# Patient Record
Sex: Female | Born: 1945
Health system: Southern US, Community
[De-identification: ages and names within clinical notes are randomized; demographics above are authoritative.]

## PROBLEM LIST (undated history)

## (undated) DIAGNOSIS — J449 Chronic obstructive pulmonary disease, unspecified: Secondary | ICD-10-CM

## (undated) DIAGNOSIS — I10 Essential (primary) hypertension: Secondary | ICD-10-CM

## (undated) DIAGNOSIS — I4891 Unspecified atrial fibrillation: Secondary | ICD-10-CM

## (undated) DIAGNOSIS — G500 Trigeminal neuralgia: Secondary | ICD-10-CM

## (undated) DIAGNOSIS — F17200 Nicotine dependence, unspecified, uncomplicated: Secondary | ICD-10-CM

## (undated) DIAGNOSIS — E785 Hyperlipidemia, unspecified: Secondary | ICD-10-CM

## (undated) DIAGNOSIS — R0600 Dyspnea, unspecified: Secondary | ICD-10-CM

## (undated) HISTORY — PX: CATARACT EXTRACTION: SUR2

## (undated) HISTORY — PX: APPENDECTOMY: SHX54

---

## 1983-07-06 HISTORY — PX: APPENDECTOMY: SHX54

## 2011-07-06 DIAGNOSIS — M1990 Unspecified osteoarthritis, unspecified site: Secondary | ICD-10-CM

## 2011-07-06 HISTORY — DX: Unspecified osteoarthritis, unspecified site: M19.90

## 2015-11-03 HISTORY — PX: EYE SURGERY: SHX253

## 2016-04-09 DIAGNOSIS — E7800 Pure hypercholesterolemia, unspecified: Secondary | ICD-10-CM | POA: Insufficient documentation

## 2016-04-09 DIAGNOSIS — E119 Type 2 diabetes mellitus without complications: Secondary | ICD-10-CM | POA: Diagnosis not present

## 2016-04-09 DIAGNOSIS — I10 Essential (primary) hypertension: Secondary | ICD-10-CM | POA: Insufficient documentation

## 2016-04-09 DIAGNOSIS — G500 Trigeminal neuralgia: Secondary | ICD-10-CM | POA: Diagnosis not present

## 2016-04-09 DIAGNOSIS — Z7189 Other specified counseling: Secondary | ICD-10-CM | POA: Insufficient documentation

## 2016-04-09 DIAGNOSIS — Z7185 Encounter for immunization safety counseling: Secondary | ICD-10-CM | POA: Insufficient documentation

## 2016-04-15 DIAGNOSIS — G500 Trigeminal neuralgia: Secondary | ICD-10-CM | POA: Diagnosis not present

## 2016-05-20 DIAGNOSIS — G500 Trigeminal neuralgia: Secondary | ICD-10-CM | POA: Diagnosis not present

## 2016-05-26 DIAGNOSIS — G500 Trigeminal neuralgia: Secondary | ICD-10-CM | POA: Diagnosis not present

## 2016-06-03 DIAGNOSIS — G500 Trigeminal neuralgia: Secondary | ICD-10-CM | POA: Diagnosis not present

## 2016-06-16 DIAGNOSIS — Z961 Presence of intraocular lens: Secondary | ICD-10-CM | POA: Diagnosis not present

## 2016-07-23 DIAGNOSIS — G500 Trigeminal neuralgia: Secondary | ICD-10-CM | POA: Diagnosis not present

## 2016-10-15 DIAGNOSIS — M25511 Pain in right shoulder: Secondary | ICD-10-CM | POA: Diagnosis not present

## 2016-10-15 DIAGNOSIS — M19012 Primary osteoarthritis, left shoulder: Secondary | ICD-10-CM | POA: Diagnosis not present

## 2016-10-15 DIAGNOSIS — M25512 Pain in left shoulder: Secondary | ICD-10-CM | POA: Diagnosis not present

## 2016-10-15 DIAGNOSIS — I10 Essential (primary) hypertension: Secondary | ICD-10-CM | POA: Diagnosis not present

## 2016-10-15 DIAGNOSIS — M19011 Primary osteoarthritis, right shoulder: Secondary | ICD-10-CM | POA: Diagnosis not present

## 2016-10-15 DIAGNOSIS — R7303 Prediabetes: Secondary | ICD-10-CM | POA: Diagnosis not present

## 2016-10-15 DIAGNOSIS — E7800 Pure hypercholesterolemia, unspecified: Secondary | ICD-10-CM | POA: Diagnosis not present

## 2016-10-22 DIAGNOSIS — Z0000 Encounter for general adult medical examination without abnormal findings: Secondary | ICD-10-CM | POA: Diagnosis not present

## 2016-10-22 DIAGNOSIS — F17200 Nicotine dependence, unspecified, uncomplicated: Secondary | ICD-10-CM | POA: Diagnosis not present

## 2016-10-22 DIAGNOSIS — E7800 Pure hypercholesterolemia, unspecified: Secondary | ICD-10-CM | POA: Diagnosis not present

## 2016-10-22 DIAGNOSIS — I10 Essential (primary) hypertension: Secondary | ICD-10-CM | POA: Diagnosis not present

## 2016-10-22 DIAGNOSIS — Z23 Encounter for immunization: Secondary | ICD-10-CM | POA: Diagnosis not present

## 2016-10-28 DIAGNOSIS — M8588 Other specified disorders of bone density and structure, other site: Secondary | ICD-10-CM | POA: Diagnosis not present

## 2016-10-29 DIAGNOSIS — M8589 Other specified disorders of bone density and structure, multiple sites: Secondary | ICD-10-CM | POA: Insufficient documentation

## 2016-11-01 DIAGNOSIS — M8580 Other specified disorders of bone density and structure, unspecified site: Secondary | ICD-10-CM | POA: Diagnosis not present

## 2016-11-19 ENCOUNTER — Encounter: Payer: PPO | Attending: Family Medicine | Admitting: Dietician

## 2016-11-19 ENCOUNTER — Encounter: Payer: Self-pay | Admitting: Dietician

## 2016-11-19 VITALS — Ht 66.0 in | Wt 142.5 lb

## 2016-11-19 DIAGNOSIS — E7849 Other hyperlipidemia: Secondary | ICD-10-CM

## 2016-11-19 DIAGNOSIS — Z029 Encounter for administrative examinations, unspecified: Secondary | ICD-10-CM | POA: Insufficient documentation

## 2016-11-19 DIAGNOSIS — G500 Trigeminal neuralgia: Secondary | ICD-10-CM | POA: Insufficient documentation

## 2016-11-19 NOTE — Patient Instructions (Addendum)
-   Try Pesto as a condiment on sandwiches rather than Mayo. It provides a source of healthy fat  - Practice eating less processed meats, or substituting for [place] or chicken versions. Example: chicken sausage, [place] bacon  - Make one small change at a time, remember slow and steady progress creates lasting changing.

## 2016-11-19 NOTE — Progress Notes (Signed)
Medical Nutrition Therapy: Visit start time: 9417  end time: 1130  Assessment:  Diagnosis: HLD, HTN Past medical history: osteopenia, see chart Psychosocial issues/ stress concerns: none Preferred learning method:  . Auditory . Visual . Hands-on  Current weight: 142.5lb  Height: 5\' 6"  Medications, supplements: MVI, Ca+Vit D3, Lipitor, Tumeric, see chart for full list  Progress and evaluation: Patient reports h/o prediabetes and family history of diabetes. Desires to learn how to eat in order to prevent being diagnosed with diabetes. Reports h/o 29# wt loss since August 2016 with ultimate goal wt of 135#. Currently 7.5# above goal wt. Interventions to date include reducing red meat intake to 2x/week, eating less starches and more vegetables, not eating fried food or drinking sugar-sweetened beverages, not cooking with salt and rarely adding salt to cooked foods. Also reports not being a big "sweet eater"- has a dessert-type item once every two weeks. States she would rather continue to use full fat products such as yogurt and butter but cut portions in half. Reports not to make much food at home. Buys pre-cooked meals, frozen meals (Healthy Choice brand) or ready-to-eat salads like potato salad.  Physical activity: walks her dog daily. Considering taking up a low-impact activity such as biking.   Dietary Intake:  Usual eating pattern includes 3 meals and 0-1 snacks per day. Dining out frequency: 1 meal every 2 weeks.  Breakfast: scallion pancake with butter & sausage, oatmeal occasionally Snack: n/a Lunch: potato salad storebought from [employer], chicken Snack: n/a Supper: half a sandwich, quesadilla with cheese + chicken, fish salad Snack: 2 girlscout cookies Beverages: low sodium  V8 juice, water, green tea, coffee, sparking water  Nutrition Care Education: Topics covered: lowering cholesterol / heart health, portion sizes and portion control, buying low sodium options, ideas for  alternative/ more healthy condiments & spreads I.e. [place] yogurt for sour cream on baked potatoes or pesto for mayonnaise on sandwiches, whole-foods based eating Basic nutrition: basic food groups, appropriate nutrient balance, general nutrition guidelines    Weight control: benefits of weight control, behavioral changes for weight loss Advanced nutrition: cooking techniques, dining out, food label reading Hypertension: identifying high sodium foods Hyperlipidemia: healthy and unhealthy fats, role of fiber Other lifestyle changes:  benefits of making changes, readiness for change, identifying habits that need to change  Nutritional Diagnosis:  NI-5.6.3 Inappropriate intake of fats (specify): saturated and trans fats As related to dietary habits/ preferences .  As evidenced by patient report of preference for and daily use of full-fat produts such as butter and Mayonnaise.  Intervention: Discussion as noted above. Goals provided to patient to work on going forward. Encouraged her to continue current weight loss effort.  Education Materials given:  . General diet guidelines for Hypertension . Your Game Plan to Prevent Type II Diabetes . Goals/ instructions  Learner/ who was taught:  . Patient   Level of understanding: Marland Kitchen Verbalizes/ demonstrates competency  Demonstrated degree of understanding via:   Teach back Learning barriers: . None  Willingness to learn/ readiness for change: . Acceptance, ready for change  Monitoring and Evaluation:  Dietary intake, exercise, and body weight      follow up: prn

## 2016-12-13 DIAGNOSIS — H353132 Nonexudative age-related macular degeneration, bilateral, intermediate dry stage: Secondary | ICD-10-CM | POA: Diagnosis not present

## 2016-12-29 DIAGNOSIS — G500 Trigeminal neuralgia: Secondary | ICD-10-CM | POA: Diagnosis not present

## 2017-01-19 ENCOUNTER — Other Ambulatory Visit: Payer: Self-pay | Admitting: Family Medicine

## 2017-01-19 DIAGNOSIS — Z1231 Encounter for screening mammogram for malignant neoplasm of breast: Secondary | ICD-10-CM

## 2017-02-03 ENCOUNTER — Ambulatory Visit
Admission: RE | Admit: 2017-02-03 | Discharge: 2017-02-03 | Disposition: A | Discharge status: Home / Self Care | Payer: PPO | Source: Ambulatory Visit | Attending: Family Medicine | Admitting: Family Medicine

## 2017-02-03 DIAGNOSIS — Z1231 Encounter for screening mammogram for malignant neoplasm of breast: Secondary | ICD-10-CM

## 2017-02-15 ENCOUNTER — Inpatient Hospital Stay
Admission: RE | Admit: 2017-02-15 | Discharge: 2017-02-15 | Disposition: A | Discharge status: Home / Self Care | Payer: Self-pay | Source: Ambulatory Visit | Attending: *Deleted | Admitting: *Deleted

## 2017-02-15 ENCOUNTER — Other Ambulatory Visit: Payer: Self-pay | Admitting: *Deleted

## 2017-02-15 DIAGNOSIS — Z9289 Personal history of other medical treatment: Secondary | ICD-10-CM

## 2017-04-21 DIAGNOSIS — I10 Essential (primary) hypertension: Secondary | ICD-10-CM | POA: Diagnosis not present

## 2017-04-21 DIAGNOSIS — F17200 Nicotine dependence, unspecified, uncomplicated: Secondary | ICD-10-CM | POA: Diagnosis not present

## 2017-04-21 DIAGNOSIS — Z23 Encounter for immunization: Secondary | ICD-10-CM | POA: Diagnosis not present

## 2017-04-21 DIAGNOSIS — E7800 Pure hypercholesterolemia, unspecified: Secondary | ICD-10-CM | POA: Diagnosis not present

## 2017-07-07 DIAGNOSIS — G500 Trigeminal neuralgia: Secondary | ICD-10-CM | POA: Diagnosis not present

## 2017-07-14 DIAGNOSIS — G500 Trigeminal neuralgia: Secondary | ICD-10-CM | POA: Diagnosis not present

## 2017-07-27 DIAGNOSIS — H353132 Nonexudative age-related macular degeneration, bilateral, intermediate dry stage: Secondary | ICD-10-CM | POA: Diagnosis not present

## 2017-09-07 DIAGNOSIS — B351 Tinea unguium: Secondary | ICD-10-CM | POA: Diagnosis not present

## 2017-09-07 DIAGNOSIS — L600 Ingrowing nail: Secondary | ICD-10-CM | POA: Diagnosis not present

## 2017-10-28 DIAGNOSIS — Z79899 Other long term (current) drug therapy: Secondary | ICD-10-CM | POA: Diagnosis not present

## 2017-10-28 DIAGNOSIS — F17200 Nicotine dependence, unspecified, uncomplicated: Secondary | ICD-10-CM | POA: Diagnosis not present

## 2017-10-28 DIAGNOSIS — E7800 Pure hypercholesterolemia, unspecified: Secondary | ICD-10-CM | POA: Diagnosis not present

## 2017-10-28 DIAGNOSIS — G500 Trigeminal neuralgia: Secondary | ICD-10-CM | POA: Diagnosis not present

## 2017-10-28 DIAGNOSIS — I10 Essential (primary) hypertension: Secondary | ICD-10-CM | POA: Diagnosis not present

## 2017-10-28 DIAGNOSIS — Z0000 Encounter for general adult medical examination without abnormal findings: Secondary | ICD-10-CM | POA: Diagnosis not present

## 2017-12-20 DIAGNOSIS — H353131 Nonexudative age-related macular degeneration, bilateral, early dry stage: Secondary | ICD-10-CM | POA: Diagnosis not present

## 2018-01-03 DIAGNOSIS — H26492 Other secondary cataract, left eye: Secondary | ICD-10-CM | POA: Diagnosis not present

## 2018-01-04 DIAGNOSIS — G500 Trigeminal neuralgia: Secondary | ICD-10-CM | POA: Diagnosis not present

## 2018-01-10 DIAGNOSIS — I499 Cardiac arrhythmia, unspecified: Secondary | ICD-10-CM | POA: Diagnosis not present

## 2018-02-01 ENCOUNTER — Other Ambulatory Visit: Payer: Self-pay | Admitting: Family Medicine

## 2018-02-01 DIAGNOSIS — Z1231 Encounter for screening mammogram for malignant neoplasm of breast: Secondary | ICD-10-CM

## 2018-03-07 ENCOUNTER — Ambulatory Visit
Admission: RE | Admit: 2018-03-07 | Discharge: 2018-03-07 | Disposition: A | Discharge status: Home / Self Care | Payer: PPO | Source: Ambulatory Visit | Attending: Family Medicine | Admitting: Family Medicine

## 2018-03-07 DIAGNOSIS — Z1231 Encounter for screening mammogram for malignant neoplasm of breast: Secondary | ICD-10-CM | POA: Diagnosis not present

## 2018-04-27 DIAGNOSIS — F17200 Nicotine dependence, unspecified, uncomplicated: Secondary | ICD-10-CM | POA: Diagnosis not present

## 2018-04-27 DIAGNOSIS — I10 Essential (primary) hypertension: Secondary | ICD-10-CM | POA: Diagnosis not present

## 2018-04-27 DIAGNOSIS — E7800 Pure hypercholesterolemia, unspecified: Secondary | ICD-10-CM | POA: Diagnosis not present

## 2018-04-28 DIAGNOSIS — G500 Trigeminal neuralgia: Secondary | ICD-10-CM | POA: Diagnosis not present

## 2018-05-08 DIAGNOSIS — G500 Trigeminal neuralgia: Secondary | ICD-10-CM | POA: Diagnosis not present

## 2018-06-07 DIAGNOSIS — G500 Trigeminal neuralgia: Secondary | ICD-10-CM | POA: Diagnosis not present

## 2018-10-10 DIAGNOSIS — R55 Syncope and collapse: Secondary | ICD-10-CM | POA: Diagnosis not present

## 2018-10-12 DIAGNOSIS — I6523 Occlusion and stenosis of bilateral carotid arteries: Secondary | ICD-10-CM | POA: Diagnosis not present

## 2018-10-12 DIAGNOSIS — R55 Syncope and collapse: Secondary | ICD-10-CM | POA: Diagnosis not present

## 2018-10-17 DIAGNOSIS — D72829 Elevated white blood cell count, unspecified: Secondary | ICD-10-CM | POA: Diagnosis not present

## 2018-10-25 DIAGNOSIS — E041 Nontoxic single thyroid nodule: Secondary | ICD-10-CM | POA: Diagnosis not present

## 2018-10-30 DIAGNOSIS — E7800 Pure hypercholesterolemia, unspecified: Secondary | ICD-10-CM | POA: Diagnosis not present

## 2018-10-30 DIAGNOSIS — F17200 Nicotine dependence, unspecified, uncomplicated: Secondary | ICD-10-CM | POA: Diagnosis not present

## 2018-10-30 DIAGNOSIS — Z0000 Encounter for general adult medical examination without abnormal findings: Secondary | ICD-10-CM | POA: Diagnosis not present

## 2018-10-30 DIAGNOSIS — I10 Essential (primary) hypertension: Secondary | ICD-10-CM | POA: Diagnosis not present

## 2018-11-06 DIAGNOSIS — G500 Trigeminal neuralgia: Secondary | ICD-10-CM | POA: Diagnosis not present

## 2018-11-15 DIAGNOSIS — E041 Nontoxic single thyroid nodule: Secondary | ICD-10-CM | POA: Diagnosis not present

## 2018-12-11 DIAGNOSIS — G500 Trigeminal neuralgia: Secondary | ICD-10-CM | POA: Diagnosis not present

## 2019-03-05 DIAGNOSIS — I10 Essential (primary) hypertension: Secondary | ICD-10-CM | POA: Diagnosis not present

## 2019-03-05 DIAGNOSIS — E7800 Pure hypercholesterolemia, unspecified: Secondary | ICD-10-CM | POA: Diagnosis not present

## 2019-03-05 DIAGNOSIS — F17200 Nicotine dependence, unspecified, uncomplicated: Secondary | ICD-10-CM | POA: Diagnosis not present

## 2019-04-10 DIAGNOSIS — G500 Trigeminal neuralgia: Secondary | ICD-10-CM | POA: Diagnosis not present

## 2019-04-11 DIAGNOSIS — S81801A Unspecified open wound, right lower leg, initial encounter: Secondary | ICD-10-CM | POA: Diagnosis not present

## 2019-04-12 DIAGNOSIS — G500 Trigeminal neuralgia: Secondary | ICD-10-CM | POA: Diagnosis not present

## 2019-04-16 ENCOUNTER — Other Ambulatory Visit: Payer: Self-pay | Admitting: Neurology

## 2019-04-16 DIAGNOSIS — G500 Trigeminal neuralgia: Secondary | ICD-10-CM

## 2019-04-19 ENCOUNTER — Other Ambulatory Visit: Payer: Self-pay | Admitting: Family Medicine

## 2019-04-19 DIAGNOSIS — Z1231 Encounter for screening mammogram for malignant neoplasm of breast: Secondary | ICD-10-CM

## 2019-04-19 DIAGNOSIS — S81801D Unspecified open wound, right lower leg, subsequent encounter: Secondary | ICD-10-CM | POA: Diagnosis not present

## 2019-04-26 ENCOUNTER — Ambulatory Visit: Payer: PPO

## 2019-04-26 ENCOUNTER — Encounter: Payer: PPO | Attending: Physician Assistant | Admitting: Physician Assistant

## 2019-04-26 ENCOUNTER — Other Ambulatory Visit: Payer: Self-pay

## 2019-04-26 DIAGNOSIS — J449 Chronic obstructive pulmonary disease, unspecified: Secondary | ICD-10-CM | POA: Insufficient documentation

## 2019-04-26 DIAGNOSIS — Z833 Family history of diabetes mellitus: Secondary | ICD-10-CM | POA: Insufficient documentation

## 2019-04-26 DIAGNOSIS — Z8249 Family history of ischemic heart disease and other diseases of the circulatory system: Secondary | ICD-10-CM | POA: Insufficient documentation

## 2019-04-26 DIAGNOSIS — Z841 Family history of disorders of kidney and ureter: Secondary | ICD-10-CM | POA: Insufficient documentation

## 2019-04-26 DIAGNOSIS — M1990 Unspecified osteoarthritis, unspecified site: Secondary | ICD-10-CM | POA: Insufficient documentation

## 2019-04-26 DIAGNOSIS — L97819 Non-pressure chronic ulcer of other part of right lower leg with unspecified severity: Secondary | ICD-10-CM | POA: Insufficient documentation

## 2019-04-26 DIAGNOSIS — I10 Essential (primary) hypertension: Secondary | ICD-10-CM | POA: Insufficient documentation

## 2019-04-26 DIAGNOSIS — M810 Age-related osteoporosis without current pathological fracture: Secondary | ICD-10-CM | POA: Insufficient documentation

## 2019-04-28 NOTE — Progress Notes (Signed)
PEARLENA, HENNIGH ([id number]) Visit Report for 04/26/2019 Allergy List Details Patient Name: Sarah Cunningham, Sarah L. Date of Service: 04/26/2019 1:15 PM Medical Record Number: [id number] Patient Account Number: [id number] Date of Birth/Sex: 08-14-1945 (73 y.o. Female) Treating RN: Sarah Cunningham Primary Care Sarah Cunningham: Sarah Cunningham Other Clinician: Referring Sarah Cunningham: Sarah Cunningham Treating Sarah Cunningham/Extender: Sarah Cunningham, Sarah Cunningham Sarah Cunningham: 0 Allergies Active Allergies Tegretol Reaction: rash and trouble breething Severity: Severe Allergy Notes Electronic Signature(s) Signed: 04/27/2019 5:23:28 PM By: Sarah Cunningham, BSN, RN, CWS, Kim RN, BSN Entered By: Sarah Cunningham, BSN, RN, CWS, Sarah Cunningham on 04/26/2019 13:32:09 Sarah Cunningham ([id number]) -------------------------------------------------------------------------------- Arrival Information Details Patient Name: Sarah Flake L. Date of Service: 04/26/2019 1:15 PM Medical Record Number: [id number] Patient Account Number: [id number] Date of Birth/Sex: 07-Jan-1946 (74 y.o. Female) Treating RN: Sarah Cunningham Primary Care Sarah Cunningham: Sarah Cunningham Other Clinician: Referring [name]: Sarah Cunningham Treating Sarah Cunningham/Extender: Sarah Cunningham, Sarah Cunningham Sarah Cunningham: 0 Visit Information Patient Arrived: Ambulatory Arrival Time: 13:26 Accompanied By: self Transfer Assistance: None Patient Identification Verified: Yes Secondary Verification Process Completed: Yes Electronic Signature(s) Signed: 04/26/2019 4:02:16 PM By: Sarah Cunningham Sarah Cunningham, Sarah Cunningham, Sarah Cunningham Entered By: Sarah Cunningham on 04/26/2019 13:27:08 Sarah Cunningham ([id number]) -------------------------------------------------------------------------------- Clinic Level of Care Assessment Details Patient Name: Sarah Cunningham, Sarah L. Date of Service: 04/26/2019 1:15 PM Medical Record Number: [id number] Patient Account Number: [id number] Date of Birth/Sex: 1946/03/15  (73 y.o. Female) Treating RN: Sarah Cunningham Primary Care Sarah Cunningham: Sarah Cunningham Other Clinician: Referring Sarah Cunningham: Sarah Cunningham Treating Sarah Cunningham/Extender: Sarah Cunningham, Sarah Cunningham Sarah Cunningham: 0 Clinic Level of Care Assessment Items TOOL 2 Quantity Score []  - Use when only an EandM is performed on the INITIAL visit 0 ASSESSMENTS - Nursing Assessment / Reassessment X - General Physical Exam (combine w/ comprehensive assessment (listed just below) when 1 20 performed on new pt. evals) X- 1 25 Comprehensive Assessment (HX, ROS, Risk Assessments, Wounds Hx, etc.) ASSESSMENTS - Wound and Skin Assessment / Reassessment X - Simple Wound Assessment / Reassessment - one wound 1 5 []  - 0 Complex Wound Assessment / Reassessment - multiple wounds []  - 0 Dermatologic / Skin Assessment (not related to wound area) ASSESSMENTS - Ostomy and/or Continence Assessment and Care []  - Incontinence Assessment and Management 0 []  - 0 Ostomy Care Assessment and Management (repouching, etc.) PROCESS - Coordination of Care X - Simple Patient / Family Education for ongoing care 1 15 []  - 0 Complex (extensive) Patient / Family Education for ongoing care X- 1 10 Staff obtains Programmer, systems, Records, Test Results / Process Orders []  - 0 Staff telephones HHA, Nursing Homes / Clarify orders / etc []  - 0 Routine Transfer to another Facility (non-emergent condition) []  - 0 Routine Hospital Admission (non-emergent condition) X- 1 15 New Admissions / Biomedical engineer / Ordering NPWT, Apligraf, etc. []  - 0 Emergency Hospital Admission (emergent condition) X- 1 10 Simple Discharge Coordination []  - 0 Complex (extensive) Discharge Coordination PROCESS - Special Needs []  - Pediatric / Minor Patient Management 0 []  - 0 Isolation Patient Management Sarah Cunningham, Sarah L. ([id number]) []  - 0 Hearing / Language / Visual special needs []  - 0 Assessment of Community assistance (transportation, D/C  planning, etc.) []  - 0 Additional assistance / Altered mentation []  - 0 Support Surface(s) Assessment (bed, cushion, seat, etc.) INTERVENTIONS - Wound Cleansing / Measurement X - Wound Imaging (photographs - any number of wounds) 1 5 []  - 0 Wound Tracing (instead of photographs) X- 1 5 Simple Wound Measurement - one wound []  - 0 Complex Wound  Measurement - multiple wounds X- 1 5 Simple Wound Cleansing - one wound []  - 0 Complex Wound Cleansing - multiple wounds INTERVENTIONS - Wound Dressings X - Small Wound Dressing one or multiple wounds 1 10 []  - 0 Medium Wound Dressing one or multiple wounds []  - 0 Large Wound Dressing one or multiple wounds []  - 0 Application of Medications - injection INTERVENTIONS - Miscellaneous []  - External ear exam 0 []  - 0 Specimen Collection (cultures, biopsies, blood, Cunningham fluids, etc.) []  - 0 Specimen(s) / Culture(s) sent or taken to Lab for analysis []  - 0 Patient Transfer (multiple staff / Civil Service fast streamer / Similar devices) []  - 0 Simple Staple / Suture removal (25 or less) []  - 0 Complex Staple / Suture removal (26 or more) []  - 0 Hypo / Hyperglycemic Management (close monitor of Blood Glucose) []  - 0 Ankle / Brachial Index (ABI) - do not check if billed separately Has the patient been seen at the hospital within the last three years: Yes Total Score: 125 Level Of Care: New/Established - Level 4 Electronic Signature(s) Signed: 04/26/2019 4:16:03 PM By: Sarah Cunningham Entered By: Sarah Cunningham on 04/26/2019 14:15:17 Sarah Cunningham, Sarah Cunningham ([id number]) -------------------------------------------------------------------------------- Encounter Discharge Information Details Patient Name: Sarah Flake L. Date of Service: 04/26/2019 1:15 PM Medical Record Number: [id number] Patient Account Number: [id number] Date of Birth/Sex: 08-31-1945 (73 y.o. Female) Treating RN: Sarah Cunningham Primary Care Sarah Cunningham: Sarah Cunningham Other  Clinician: Referring Sarah Cunningham: Sarah Cunningham Treating Sarah Cunningham/Extender: Sarah Cunningham, Sarah Cunningham Sarah Cunningham: 0 Encounter Discharge Information Items Discharge Condition: Stable Ambulatory Status: Ambulatory Discharge Destination: Home Transportation: Private Auto Accompanied By: self Schedule Follow-up Appointment: Yes Clinical Summary of Care: Electronic Signature(s) Signed: 04/26/2019 4:16:03 PM By: Sarah Cunningham Entered By: Sarah Cunningham on 04/26/2019 14:17:24 Arno, Jaina L. ([id number]) -------------------------------------------------------------------------------- Lower Extremity Assessment Details Patient Name: Namba, Daisey L. Date of Service: 04/26/2019 1:15 PM Medical Record Number: [id number] Patient Account Number: [id number] Date of Birth/Sex: 1945-11-17 (73 y.o. Female) Treating RN: Sarah Cunningham Primary Care Shermika Balthaser: Sarah Cunningham Other Clinician: Referring Chandler Swiderski: Sarah Cunningham Treating Boby Eyer/Extender: Sarah Cunningham, Sarah Cunningham Sarah Cunningham: 0 Edema Assessment Assessed: [Left: No] [Right: Yes] Edema: [Left: N] [Right: o] Vascular Assessment Pulses: Dorsalis Pedis Palpable: [Right:Yes] Electronic Signature(s) Signed: 04/27/2019 5:23:28 PM By: Sarah Cunningham, BSN, RN, CWS, Kim RN, BSN Entered By: Sarah Cunningham, BSN, RN, CWS, Sarah Cunningham on 04/26/2019 13:44:30 Sarah Cunningham, Sarah Cunningham ([id number]) -------------------------------------------------------------------------------- Multi Wound Chart Details Patient Name: Sarah Flake L. Date of Service: 04/26/2019 1:15 PM Medical Record Number: [id number] Patient Account Number: [id number] Date of Birth/Sex: 06/15/1946 (73 y.o. Female) Treating RN: Sarah Cunningham Primary Care Yuleimy Kretz: Sarah Cunningham Other Clinician: Referring Ryanna Teschner: Sarah Cunningham Treating Kayron Kalmar/Extender: Sarah Cunningham, Sarah Cunningham Sarah Cunningham: 0 Vital Signs Height(in): 51 Pulse(bpm): 25 Weight(lbs): 138 Blood Pressure(mmHg): 150/84 Cunningham Mass  Index(BMI): 22 Temperature(F): 98.1 Respiratory Rate 16 (breaths/min): Photos: [N/A:N/A] Wound Location: Right Lower Leg - Medial N/A N/A Wounding Event: Trauma N/A N/A Primary Etiology: Trauma, Other N/A N/A Comorbid History: Cataracts, Chronic Obstructive N/A N/A Pulmonary Disease (COPD), Hypertension Date Acquired: 04/06/2019 N/A N/A Sarah of Cunningham: 0 N/A N/A Wound Status: Open N/A N/A Measurements L x W x D 0.9x1x0.1 N/A N/A (cm) Area (cm) : 0.707 N/A N/A Volume (cm) : 0.071 N/A N/A Classification: Unclassifiable N/A N/A Exudate Amount: None Present N/A N/A Wound Margin: Flat and Intact N/A N/A Granulation Amount: None Present (0%) N/A N/A Necrotic Amount: Large (67-100%) N/A N/A Necrotic Tissue: Eschar N/A N/A Exposed Structures: Fascia: No N/A N/A Fat Layer (  Subcutaneous Tissue) Exposed: No Tendon: No Muscle: No Joint: No Bone: No Epithelialization: None N/A N/A Cunningham Notes Sarah Cunningham, Sarah Cunningham ([id number]) Electronic Signature(s) Signed: 04/26/2019 4:16:03 PM By: Sarah Cunningham Entered By: Sarah Cunningham on 04/26/2019 14:13:25 Sarah Cunningham, Sarah Cunningham ([id number]) -------------------------------------------------------------------------------- Pain Assessment Details Patient Name: Sarah Cunningham, Sarah L. Date of Service: 04/26/2019 1:15 PM Medical Record Number: [id number] Patient Account Number: [id number] Date of Birth/Sex: 01/08/1946 (73 y.o. Female) Treating RN: Sarah Cunningham Primary Care Nyair Depaulo: Sarah Cunningham Other Clinician: Referring Bearett Porcaro: Sarah Cunningham Treating Kieran Nachtigal/Extender: Sarah Cunningham, Sarah Cunningham Sarah Cunningham: 0 Active Problems Location of Pain Severity and Description of Pain Patient Has Paino No Site Locations Pain Management and Medication Current Pain Management: Electronic Signature(s) Signed: 04/26/2019 1:28:54 PM By: Sarah Cunningham Signed: 04/26/2019 4:02:16 PM By: Sarah Cunningham Sarah Cunningham, Sarah Cunningham, Sarah Cunningham Entered By:  Sarah Cunningham on 04/26/2019 13:27:18 Sarah Cunningham, Sarah Cunningham ([id number]) -------------------------------------------------------------------------------- Patient/Caregiver Education Details Patient Name: Sarah Flake L. Date of Service: 04/26/2019 1:15 PM Medical Record Number: [id number] Patient Account Number: [id number] Date of Birth/Gender: 1946/05/01 (73 y.o. Female) Treating RN: Sarah Cunningham Primary Care Physician: Sarah Cunningham Other Clinician: Referring Physician: Kathie Cunningham Treating Physician/Extender: Sharalyn Ink in Cunningham: 0 Education Assessment Education Provided To: Patient Education Topics Provided Wound/Skin Impairment: Handouts: Caring for Your Ulcer Methods: Demonstration, Explain/Verbal Responses: State content correctly Electronic Signature(s) Signed: 04/26/2019 4:16:03 PM By: Sarah Cunningham Entered By: Sarah Cunningham on 04/26/2019 14:15:30 Sarah Cunningham, Sarah Cunningham ([id number]) -------------------------------------------------------------------------------- Wound Assessment Details Patient Name: Sarah Cunningham, Sarah L. Date of Service: 04/26/2019 1:15 PM Medical Record Number: [id number] Patient Account Number: [id number] Date of Birth/Sex: 1946/03/02 (73 y.o. Female) Treating RN: Sarah Cunningham Primary Care Maclin Guerrette: Sarah Cunningham Other Clinician: Referring Jeanna Giuffre: Sarah Cunningham Treating Lonald Troiani/Extender: Sarah Cunningham, Sarah Cunningham Sarah Cunningham: 0 Wound Status Wound Number: 1 Primary Trauma, Other Etiology: Wound Location: Right Lower Leg - Medial Wound Open Wounding Event: Trauma Status: Date Acquired: 04/06/2019 Comorbid Cataracts, Chronic Obstructive Pulmonary Sarah Of Cunningham: 0 History: Disease (COPD), Hypertension Clustered Wound: No Photos Wound Measurements Length: (cm) 0.9 % Reduction Width: (cm) 1 % Reduction Depth: (cm) 0.1 Epithelializ Area: (cm) 0.707 Tunneling: Volume: (cm) 0.071 Undermining in  Area: in Volume: ation: None No : No Wound Description Classification: Unclassifiable Foul Odor Af Wound Margin: Flat and Intact Slough/Fibri Exudate Amount: None Present ter Cleansing: No no No Wound Bed Granulation Amount: None Present (0%) Exposed Structure Necrotic Amount: Large (67-100%) Fascia Exposed: No Necrotic Quality: Eschar Fat Layer (Subcutaneous Tissue) Exposed: No Tendon Exposed: No Muscle Exposed: No Joint Exposed: No Bone Exposed: No Cunningham Notes Wound #1 (Right, Medial Lower Leg) Notes TANGANIKA, MEINEKE. ([id number]) betadine Electronic Signature(s) Signed: 04/27/2019 5:23:28 PM By: Sarah Cunningham, BSN, RN, CWS, Kim RN, BSN Entered By: Sarah Cunningham, BSN, RN, CWS, Sarah Cunningham on 04/26/2019 13:44:08 [place], Sarah Cunningham ([id number]) -------------------------------------------------------------------------------- Vitals Details Patient Name: Sarah Flake L. Date of Service: 04/26/2019 1:15 PM Medical Record Number: [id number] Patient Account Number: [id number] Date of Birth/Sex: [birth year] (73 y.o. Female) Treating RN: Sarah Cunningham Primary Care Rayana Geurin: Sarah Cunningham Other Clinician: Referring Luisenrique Conran: Sarah Cunningham Treating Mackenzye Mackel/Extender: Sarah Cunningham, Sarah Cunningham Sarah Cunningham: 0 Vital Signs Time Taken: 13:27 Temperature (F): 98.1 Height (in): 66 Pulse (bpm): 87 Source: Stated Respiratory Rate (breaths/min): 16 Weight (lbs): 138 Blood Pressure (mmHg): 150/84 Source: Stated Reference Range: 80 - 120 mg / dl Cunningham Mass Index (BMI): 22.3 Electronic Signature(s) Signed: 04/26/2019 4:02:16 PM By: Sarah Cunningham Sarah Cunningham, Sarah Cunningham, Sarah Cunningham Entered By: Sarah Cunningham on 04/26/2019 13:30:26

## 2019-04-28 NOTE — Progress Notes (Signed)
Cunningham, Sarah ([id number]) Visit Report for 04/26/2019 Chief Complaint Document Details Patient Name: Sarah Cunningham, Sarah Cunningham. Date of Service: 04/26/2019 1:15 PM Medical Record Number: [id number] Patient Account Number: [id number] Date of Birth/Sex: Jun 26, 1946 (73 y.o. Female) Treating RN: Army Melia Primary Care Provider: Dion Body Other Clinician: Referring Provider: Kathie Dike Treating Provider/Extender: Melburn Hake, HOYT Weeks in Treatment: 0 Information Obtained from: Patient Chief Complaint Right LE Ulcer Electronic Signature(s) Signed: 04/26/2019 1:51:12 PM By: Worthy Keeler PA-C Entered By: Worthy Keeler on 04/26/2019 13:51:12 Corozal, Tavernier L. ([id number]) -------------------------------------------------------------------------------- HPI Details Patient Name: Sarah Flake L. Date of Service: 04/26/2019 1:15 PM Medical Record Number: [id number] Patient Account Number: [id number] Date of Birth/Sex: December 16, 1945 (73 y.o. Female) Treating RN: Army Melia Primary Care Provider: Dion Body Other Clinician: Referring Provider: Kathie Dike Treating Provider/Extender: Melburn Hake, HOYT Weeks in Treatment: 0 History of Present Illness HPI Description: 04/26/2019 upon evaluation today patient actually appears to be doing actually better she tells me than what she has been over the past couple of weeks. She had an injury to her lower extremity on the left and subsequently this was a Cunningham significant laceration/skin tear that she was quite concerned about. She went to urgent care and they treated this initially with Silvadene which really did not do very well for her she tells me. Subsequently she ended up having to go back to urgent care at which point they placed her on clindamycin which she is done with at this point. Subsequently they also went ahead and recommended that she pretty much clean the area and leave this open to air and since that  time it has been drying out and doing significantly better. The patient tells me that "I am really not sure why I am here as this already seems to be feeling better". Nonetheless she did come in for evaluation today. Patient does have a history of hypertension as well as COPD but appears to be in good health at the moment with no complications. There is no sign of local or systemic infection. No fevers, chills, nausea, vomiting, or diarrhea. Electronic Signature(s) Signed: 04/26/2019 6:12:16 PM By: Worthy Keeler PA-C Entered By: Worthy Keeler on 04/26/2019 18:12:16 Hazelip, Anaid Carlean Jews ([id number]) -------------------------------------------------------------------------------- Physical Exam Details Patient Name: Cunningham, Sarah L. Date of Service: 04/26/2019 1:15 PM Medical Record Number: [id number] Patient Account Number: [id number] Date of Birth/Sex: [birth year] (73 y.o. Female) Treating RN: Army Melia Primary Care Provider: Dion Body Other Clinician: Referring Provider: Kathie Dike Treating Provider/Extender: Melburn Hake, HOYT Weeks in Treatment: 0 Constitutional patient is hypertensive.. pulse regular and within target range for patient.Marland Kitchen respirations regular, non-labored and within target range for patient.Marland Kitchen temperature within target range for patient.. Well-nourished and well-hydrated in no acute distress. Eyes conjunctiva clear no eyelid edema noted. pupils equal round and reactive to light and accommodation. Ears, Nose, Mouth, and Throat no gross abnormality of ear auricles or external auditory canals. normal hearing noted during conversation. mucus membranes moist. Respiratory normal breathing without difficulty. clear to auscultation bilaterally. Cardiovascular regular rate and rhythm with normal S1, S2. 2+ dorsalis pedis/posterior tibialis pulses. no clubbing, cyanosis, significant edema, <3 sec cap refill. Gastrointestinal (GI) soft, non-tender,  non-distended, +BS. no ventral hernia noted. Musculoskeletal normal gait and posture. no significant deformity or arthritic changes, no loss or range of motion, no clubbing. Psychiatric this patient is able to make decisions and demonstrates good insight into disease process. Alert and Oriented x 3. pleasant and cooperative. Notes Upon evaluation patient's wound  actually appears to be Cunningham well scabbed with a solid eschar overlying the surface of the wound. She is not really having any significant pain which is good news. With that being said she really does not want to proceed with any debridement and treatment as far as this is concerned. She is happy with the fact that it is dry and seems to be doing well in that regard. She is not really swollen so I am not concerned about edema complicating the healing process and in fact looking at the wound area she already seems to be healing quite nicely. Electronic Signature(s) Signed: 04/26/2019 6:12:56 PM By: Worthy Keeler PA-C Entered By: Worthy Keeler on 04/26/2019 18:12:56 Lantis, Sarah Cunningham ([id number]) -------------------------------------------------------------------------------- Physician Orders Details Patient Name: Sarah Flake L. Date of Service: 04/26/2019 1:15 PM Medical Record Number: [id number] Patient Account Number: [id number] Date of Birth/Sex: 1946-06-05 (73 y.o. Female) Treating RN: Army Melia Primary Care Provider: Dion Body Other Clinician: Referring Provider: Kathie Dike Treating Provider/Extender: Melburn Hake, HOYT Weeks in Treatment: 0 Verbal / Phone Orders: No Diagnosis Coding ICD-10 Coding Code Description S81.801A Unspecified open wound, right lower leg, initial encounter L97.818 Non-pressure chronic ulcer of other part of right lower leg with other specified severity I10 Essential (primary) hypertension J44.9 Chronic obstructive pulmonary disease, unspecified Wound Cleansing Wound #1  Right,Medial Lower Leg o Clean wound with Normal Saline. - in office o Dial antibacterial soap, wash wounds, rinse and pat dry prior to dressing wounds Primary Wound Dressing Wound #1 Right,Medial Lower Leg o Other: - betadine Dressing Change Frequency Wound #1 Right,Medial Lower Leg o Change dressing every day. - apply betadine once daily Follow-up Appointments Wound #1 Right,Medial Lower Leg o Return Appointment in 2 weeks. Electronic Signature(s) Signed: 04/26/2019 4:16:03 PM By: Army Melia Signed: 04/26/2019 6:36:47 PM By: Worthy Keeler PA-C Entered By: Army Melia on 04/26/2019 14:14:51 [place], [place] ([id number]) -------------------------------------------------------------------------------- Problem List Details Patient Name: Ridener, Nury L. Date of Service: 04/26/2019 1:15 PM Medical Record Number: [id number] Patient Account Number: [id number] Date of Birth/Sex: Nov 18, 1945 (73 y.o. Female) Treating RN: Army Melia Primary Care Provider: Dion Body Other Clinician: Referring Provider: Kathie Dike Treating Provider/Extender: Melburn Hake, HOYT Weeks in Treatment: 0 Active Problems ICD-10 Evaluated Encounter Code Description Active Date Today Diagnosis S81.801A Unspecified open wound, right lower leg, initial encounter 04/26/2019 No Yes L97.818 Non-pressure chronic ulcer of other part of right lower leg 04/26/2019 No Yes with other specified severity I10 Essential (primary) hypertension 04/26/2019 No Yes J44.9 Chronic obstructive pulmonary disease, unspecified 04/26/2019 No Yes Inactive Problems Resolved Problems Electronic Signature(s) Signed: 04/26/2019 1:50:55 PM By: Worthy Keeler PA-C Entered By: Worthy Keeler on 04/26/2019 13:50:55 Knupp, Janda L. ([id number]) -------------------------------------------------------------------------------- Progress Note Details Patient Name: Wynder, Cady L. Date of Service: 04/26/2019  1:15 PM Medical Record Number: [id number] Patient Account Number: [id number] Date of Birth/Sex: 07/04/1946 (73 y.o. Female) Treating RN: Army Melia Primary Care Provider: Dion Body Other Clinician: Referring Provider: Kathie Dike Treating Provider/Extender: Melburn Hake, HOYT Weeks in Treatment: 0 Subjective Chief Complaint Information obtained from Patient Right LE Ulcer History of Present Illness (HPI) 04/26/2019 upon evaluation today patient actually appears to be doing actually better she tells me than what she has been over the past couple of weeks. She had an injury to her lower extremity on the left and subsequently this was a Cunningham significant laceration/skin tear that she was quite concerned about. She went to urgent care and they treated this initially with Silvadene which  really did not do very well for her she tells me. Subsequently she ended up having to go back to urgent care at which point they placed her on clindamycin which she is done with at this point. Subsequently they also went ahead and recommended that she pretty much clean the area and leave this open to air and since that time it has been drying out and doing significantly better. The patient tells me that "I am really not sure why I am here as this already seems to be feeling better". Nonetheless she did come in for evaluation today. Patient does have a history of hypertension as well as COPD but appears to be in good health at the moment with no complications. There is no sign of local or systemic infection. No fevers, chills, nausea, vomiting, or diarrhea. Patient History Information obtained from Patient. Allergies Tegretol (Severity: Severe, Reaction: rash and trouble breething) Family History Cancer - Father,Mother, Diabetes - Siblings, Heart Disease - Maternal Grandparents,Paternal Grandparents, Kidney Disease - Siblings. Social History Current every day smoker - 1/2 pack daily, Marital  Status - Widowed, Alcohol Use - Rarely, Drug Use - No History, Caffeine Use - Daily. Medical History Eyes Patient has history of Cataracts - 2017 Bilateral removal Respiratory Patient has history of Chronic Obstructive Pulmonary Disease (COPD) Cardiovascular Patient has history of Hypertension Medical And Surgical History Notes Musculoskeletal Arthritis-Right Hip, Osteoporosis Review of Systems (ROS) Constitutional Symptoms (General Health) [place], [place] ([id number]) Denies complaints or symptoms of Fatigue, Fever, Chills, Marked Weight Change. Eyes Denies complaints or symptoms of Dry Eyes, Vision Changes, Glasses / Contacts. Ear/Nose/Mouth/Throat Denies complaints or symptoms of Difficult clearing ears, Sinusitis. Hematologic/Lymphatic Denies complaints or symptoms of Bleeding / Clotting Disorders, Human Immunodeficiency Virus. Cardiovascular Hyperlipidemia Gastrointestinal Denies complaints or symptoms of Frequent diarrhea, Nausea, Vomiting. Endocrine Denies complaints or symptoms of Hepatitis, Thyroid disease, Polydypsia (Excessive Thirst). Genitourinary Denies complaints or symptoms of Kidney failure/ Dialysis, Incontinence/dribbling. Immunological Denies complaints or symptoms of Hives, Itching. Integumentary (Skin) Complains or has symptoms of Wounds. Musculoskeletal Denies complaints or symptoms of Muscle Pain, Muscle Weakness. Neurologic Complains or has symptoms of Numbness/parasthesias - Trigeminal neuralgia. Psychiatric Denies complaints or symptoms of Anxiety, Claustrophobia. Objective Constitutional patient is hypertensive.. pulse regular and within target range for patient.Marland Kitchen respirations regular, non-labored and within target range for patient.Marland Kitchen temperature within target range for patient.. Well-nourished and well-hydrated in no acute distress. Vitals Time Taken: 1:27 PM, Height: 66 in, Source: Stated, Weight: 138 lbs, Source: Stated, BMI: 22.3,  Temperature: 98.1 F, Pulse: 87 bpm, Respiratory Rate: 16 breaths/min, Blood Pressure: 150/84 mmHg. Eyes conjunctiva clear no eyelid edema noted. pupils equal round and reactive to light and accommodation. Ears, Nose, Mouth, and Throat no gross abnormality of ear auricles or external auditory canals. normal hearing noted during conversation. mucus membranes moist. Respiratory normal breathing without difficulty. clear to auscultation bilaterally. Cardiovascular regular rate and rhythm with normal S1, S2. 2+ dorsalis pedis/posterior tibialis pulses. no clubbing, cyanosis, significant edema, Gastrointestinal (GI) soft, non-tender, non-distended, +BS. no ventral hernia noted. [place] ([id number]) Musculoskeletal normal gait and posture. no significant deformity or arthritic changes, no loss or range of motion, no clubbing. Psychiatric this patient is able to make decisions and demonstrates good insight into disease process. Alert and Oriented x 3. pleasant and cooperative. General Notes: Upon evaluation patient's wound actually appears to be Cunningham well scabbed with a solid eschar overlying the surface of the wound. She is not really having any significant pain which is good  news. With that being said she really does not want to proceed with any debridement and treatment as far as this is concerned. She is happy with the fact that it is dry and seems to be doing well in that regard. She is not really swollen so I am not concerned about edema complicating the healing process and in fact looking at the wound area she already seems to be healing quite nicely. Integumentary (Hair, Skin) Wound #1 status is Open. Original cause of wound was Trauma. The wound is located on the Right,Medial Lower Leg. The wound measures 0.9cm length x 1cm width x 0.1cm depth; 0.707cm^2 area and 0.071cm^3 volume. There is no tunneling or undermining noted. There is a none present amount of drainage noted.  The wound margin is flat and intact. There is no granulation within the wound bed. There is a large (67-100%) amount of necrotic tissue within the wound bed including Eschar. Assessment Active Problems ICD-10 Unspecified open wound, right lower leg, initial encounter Non-pressure chronic ulcer of other part of right lower leg with other specified severity Essential (primary) hypertension Chronic obstructive pulmonary disease, unspecified Plan Wound Cleansing: Wound #1 Right,Medial Lower Leg: Clean wound with Normal Saline. - in office Dial antibacterial soap, wash wounds, rinse and pat dry prior to dressing wounds Primary Wound Dressing: Wound #1 Right,Medial Lower Leg: Other: - betadine Dressing Change Frequency: Wound #1 Right,Medial Lower Leg: Change dressing every day. - apply betadine once daily Follow-up Appointments: Wound #1 Right,Medial Lower Leg: Return Appointment in 2 weeks. Shaff, Ronny L. ([id number]) 1. Since the patient really did not want undergo any debridement and subsequent dressing changes and really was happy with leaving this open to air I am to recommend that we actually initiate treatment with Betadine to be painted on the wound daily. She is definitely in agreement with this. 2. I would recommend that she clean the area normally as she has been doing since that seems to have been doing well for her. Subsequently she will just leave this open to air following the application of the Betadine. We will see patient back for reevaluation in 2 weeks here in the clinic. If anything worsens or changes patient will contact our office for additional recommendations. If she decides that things are getting worse and she would like to undergo debridement and then subsequent advanced dressings I would be more than happy to proceed as such. With that being said she really feels like in the next 2 weeks this will be healed. For that reason we will make her the appointment  but if she is doing better she can always cancel that appointment by calling the office and letting us know she is healed and does not need it. Electronic Signature(s) Signed: 04/26/2019 6:14:06 PM By: Worthy Keeler PA-C Entered By: Worthy Keeler on 04/26/2019 18:14:06 Kretchmer, Anarosa Carlean Jews ([id number]) -------------------------------------------------------------------------------- ROS/PFSH Details Patient Name: Sarah Flake L. Date of Service: 04/26/2019 1:15 PM Medical Record Number: [id number] Patient Account Number: [id number] Date of Birth/Sex: 1945/08/22 (73 y.o. Female) Treating RN: Harold Barban Primary Care Provider: Dion Body Other Clinician: Referring Provider: Kathie Dike Treating Provider/Extender: Melburn Hake, HOYT Weeks in Treatment: 0 Information Obtained From Patient Constitutional Symptoms (General Health) Complaints and Symptoms: Negative for: Fatigue; Fever; Chills; Marked Weight Change Eyes Complaints and Symptoms: Negative for: Dry Eyes; Vision Changes; Glasses / Contacts Medical History: Positive for: Cataracts - 2017 Bilateral removal Ear/Nose/Mouth/Throat Complaints and Symptoms: Negative for: Difficult clearing ears; Sinusitis Hematologic/Lymphatic Complaints and Symptoms:  Negative for: Bleeding / Clotting Disorders; Human Immunodeficiency Virus Gastrointestinal Complaints and Symptoms: Negative for: Frequent diarrhea; Nausea; Vomiting Endocrine Complaints and Symptoms: Negative for: Hepatitis; Thyroid disease; Polydypsia (Excessive Thirst) Genitourinary Complaints and Symptoms: Negative for: Kidney failure/ Dialysis; Incontinence/dribbling Immunological Complaints and Symptoms: Negative for: Hives; Itching Integumentary (Skin) Complaints and Symptoms: Positive for: Wounds Diloreto, Shallyn L. ([id number]) Musculoskeletal Complaints and Symptoms: Negative for: Muscle Pain; Muscle Weakness Medical History: Past Medical  History Notes: Arthritis-Right Hip, Osteoporosis Neurologic Complaints and Symptoms: Positive for: Numbness/parasthesias - Trigeminal neuralgia Psychiatric Complaints and Symptoms: Negative for: Anxiety; Claustrophobia Respiratory Medical History: Positive for: Chronic Obstructive Pulmonary Disease (COPD) Cardiovascular Complaints and Symptoms: Review of System Notes: Hyperlipidemia Medical History: Positive for: Hypertension Oncologic HBO Extended History Items Eyes: Cataracts Immunizations Pneumococcal Vaccine: Received Pneumococcal Vaccination: Yes Implantable Devices None Family and Social History Cancer: Yes - Father,Mother; Diabetes: Yes - Siblings; Heart Disease: Yes - Maternal Grandparents,Paternal Grandparents; Kidney Disease: Yes - Siblings; Current every day smoker - 1/2 pack daily; Marital Status - Widowed; Alcohol Use: Rarely; Drug Use: No History; Caffeine Use: Daily Electronic Signature(s) Signed: 04/26/2019 4:05:35 PM By: Harold Barban Signed: 04/26/2019 6:36:47 PM By: Worthy Keeler PA-C Signed: 04/27/2019 5:23:28 PM By: Gretta Cool, BSN, RN, CWS, Kim RN, BSN Entered By: Gretta Cool, BSN, RN, CWS, Kim on 04/26/2019 13:35:20 RAJENE, QUINTER ([id number]) TKAI, TILEY ([id number]) -------------------------------------------------------------------------------- [place] Details Patient Name: Alexopoulos, Deborrah L. Date of Service: 04/26/2019 Medical Record Number: [id number] Patient Account Number: [id number] Date of Birth/Sex: 11-02-1945 (73 y.o. Female) Treating RN: Army Melia Primary Care Provider: Dion Body Other Clinician: Referring Provider: Kathie Dike Treating Provider/Extender: Melburn Hake, HOYT Weeks in Treatment: 0 Diagnosis Coding ICD-10 Codes Code Description S81.801A Unspecified open wound, right lower leg, initial encounter L97.818 Non-pressure chronic ulcer of other part of right lower leg with other specified severity I10  Essential (primary) hypertension J44.9 Chronic obstructive pulmonary disease, unspecified Facility Procedures CPT4 Code: [id number] Description: [id number] - WOUND CARE VISIT-LEV 4 EST PT Modifier: Quantity: 1 Physician Procedures CPT4 Code Description: [id number] WC PHYS LEVEL 3 o NEW PT ICD-10 Diagnosis Description S81.801A Unspecified open wound, right lower leg, initial encounter L97.818 Non-pressure chronic ulcer of other part of right lower leg wi I10 Essential (primary)  hypertension J44.9 Chronic obstructive pulmonary disease, unspecified Modifier: th other specified Quantity: 1 severity Electronic Signature(s) Signed: 04/26/2019 6:14:25 PM By: Worthy Keeler PA-C Entered By: Worthy Keeler on 04/26/2019 18:14:25

## 2019-04-28 NOTE — Progress Notes (Signed)
Sarah, Cunningham ([id number]) Visit Report for 04/26/2019 Abuse/Suicide Risk Screen Details Patient Name: Sarah Cunningham, Sarah L. Date of Service: 04/26/2019 1:15 PM Medical Record Number: [id number] Patient Account Number: [id number] Date of Birth/Sex: 1946/06/24 (73 y.o. Female) Treating RN: Cornell Barman Primary Care Ailyn Gladd: Dion Body Other Clinician: Referring Terrance Usery: Kathie Dike Treating Lashawnna Lambrecht/Extender: Melburn Hake, HOYT Weeks in Treatment: 0 Abuse/Suicide Risk Screen Items Answer ABUSE RISK SCREEN: Has anyone close to you tried to hurt or harm you recentlyo No Do you feel uncomfortable with anyone in your familyo No Has anyone forced you do things that you didnot want to doo No Electronic Signature(s) Signed: 04/27/2019 5:23:28 PM By: Gretta Cool, BSN, RN, CWS, Kim RN, BSN Entered By: Gretta Cool, BSN, RN, CWS, Kim on 04/26/2019 13:35:29 [place], [name] ([id number]) -------------------------------------------------------------------------------- Activities of Daily Living Details Patient Name: Sarah, [name] Date of Service: 04/26/2019 1:15 PM Medical Record Number: [id number] Patient Account Number: [id number] Date of Birth/Sex: 11-17-1945 (73 y.o. Female) Treating RN: Cornell Barman Primary Care Jamiaya Bina: Dion Body Other Clinician: Referring Quinci Gavidia: Kathie Dike Treating Malcolm Hetz/Extender: Melburn Hake, HOYT Weeks in Treatment: 0 Activities of Daily Living Items Answer Activities of Daily Living (Please select one for each item) Drive Automobile Completely Able Take Medications Completely Able Use Telephone Completely Able Care for Appearance Completely Able Use Toilet Completely Able Bath / Shower Completely Able Dress Self Completely Able Feed Self Completely Able Walk Completely Able Get In / Out Bed Completely Able Housework Completely Able Prepare Meals Completely Able Handle Money Completely Able Shop for Self Completely Able Electronic  Signature(s) Signed: 04/27/2019 5:23:28 PM By: Gretta Cool, BSN, RN, CWS, Kim RN, BSN Entered By: Gretta Cool, BSN, RN, CWS, Kim on 04/26/2019 13:35:43 [name] ([id number]) -------------------------------------------------------------------------------- Education Screening Details Patient Name: Sarah, [name] Date of Service: 04/26/2019 1:15 PM Medical Record Number: [id number] Patient Account Number: [id number] Date of Birth/Sex: [birth year] (73 y.o. Female) Treating RN: Cornell Barman Primary Care Shatara Stanek: Dion Body Other Clinician: Referring Artisha Capri: Kathie Dike Treating Kylian Loh/Extender: Sharalyn Ink in Treatment: 0 Primary Learner Assessed: Patient Learning Preferences/Education Level/Primary Language Learning Preference: Explanation, Demonstration Highest Education Level: College or Above Preferred Language: English Cognitive Barrier Language Barrier: No Translator Needed: No Memory Deficit: No Emotional Barrier: No Cultural/Religious Beliefs Affecting Medical Care: No Physical Barrier Impaired Vision: Yes Impaired Hearing: No Decreased Hand dexterity: No Knowledge/Comprehension Knowledge Level: High Comprehension Level: High Ability to understand written High instructions: Ability to understand verbal High instructions: Motivation Anxiety Level: Calm Cooperation: Cooperative Education Importance: Acknowledges Need Interest in Health Problems: Asks Questions Perception: Coherent Willingness to Engage in Self- High Management Activities: Readiness to Engage in Self- High Management Activities: Electronic Signature(s) Signed: 04/27/2019 5:23:28 PM By: Gretta Cool, BSN, RN, CWS, Kim RN, BSN Entered By: Gretta Cool, BSN, RN, CWS, Kim on 04/26/2019 13:36:14 [name] ([id number]) -------------------------------------------------------------------------------- Fall Risk Assessment Details Patient Name: Sarah Flake L. Date of Service:  04/26/2019 1:15 PM Medical Record Number: [id number] Patient Account Number: [id number] Date of Birth/Sex: 31-Jul-1945 (73 y.o. Female) Treating RN: Cornell Barman Primary Care Jas Betten: Dion Body Other Clinician: Referring Glorianne Proctor: Kathie Dike Treating Kailo Kosik/Extender: Melburn Hake, HOYT Weeks in Treatment: 0 Fall Risk Assessment Items Have you had 2 or more falls in the last 12 monthso 0 No Have you had any fall that resulted in injury in the last 12 monthso 0 No FALLS RISK SCREEN History of falling - immediate or within 3 months 0 No Secondary diagnosis (Do you have 2 or more medical diagnoseso) 0 No Ambulatory  aid None/bed rest/wheelchair/nurse 0 Yes Crutches/cane/walker 0 No Furniture 0 No Intravenous therapy Access/Saline/Heparin Lock 0 No Gait/Transferring Normal/ bed rest/ wheelchair 0 Yes Weak (short steps with or without shuffle, stooped but able to lift head while 0 No walking, may seek support from furniture) Impaired (short steps with shuffle, may have difficulty arising from chair, head 0 No down, impaired balance) Mental Status Oriented to own ability 0 Yes Electronic Signature(s) Signed: 04/27/2019 5:23:28 PM By: Gretta Cool, BSN, RN, CWS, Kim RN, BSN Entered By: Gretta Cool, BSN, RN, CWS, Kim on 04/26/2019 13:36:42 [place], [name] ([id number]) -------------------------------------------------------------------------------- Foot Assessment Details Patient Name: Sarah, [name] Date of Service: 04/26/2019 1:15 PM Medical Record Number: [id number] Patient Account Number: [id number] Date of Birth/Sex: [birth year] (73 y.o. Female) Treating RN: Cornell Barman Primary Care Justis Dupas: Dion Body Other Clinician: Referring Annabelle Rexroad: Kathie Dike Treating Tamy Accardo/Extender: Melburn Hake, HOYT Weeks in Treatment: 0 Foot Assessment Items Site Locations + = Sensation present, - = Sensation absent, C = Callus, U = Ulcer R = Redness, W = Warmth, M = Maceration, PU  = Pre-ulcerative lesion F = Fissure, S = Swelling, D = Dryness Assessment Right: Left: Other Deformity: No No Prior Foot Ulcer: No No Prior Amputation: No No Charcot Joint: No No Ambulatory Status: Ambulatory Without Help Gait: Steady Electronic Signature(s) Signed: 04/27/2019 5:23:28 PM By: Gretta Cool, BSN, RN, CWS, Kim RN, BSN Entered By: Gretta Cool, BSN, RN, CWS, Kim on 04/26/2019 13:41:17 [place], [name] ([id number]) -------------------------------------------------------------------------------- Nutrition Risk Screening Details Patient Name: Goodwill, Adilene L. Date of Service: 04/26/2019 1:15 PM Medical Record Number: [id number] Patient Account Number: [id number] Date of Birth/Sex: 05/01/1946 (73 y.o. Female) Treating RN: Cornell Barman Primary Care Marselino Slayton: Dion Body Other Clinician: Referring Leannah Guse: Kathie Dike Treating Lemont Sitzmann/Extender: Melburn Hake, HOYT Weeks in Treatment: 0 Height (in): 66 Weight (lbs): 138 Body Mass Index (BMI): 22.3 Nutrition Risk Screening Items Score Screening NUTRITION RISK SCREEN: I have an illness or condition that made me change the kind and/or amount of 0 No food I eat I eat fewer than two meals per day 0 No I eat few fruits and vegetables, or milk products 0 No I have three or more drinks of beer, liquor or wine almost every day 0 No I have tooth or mouth problems that make it hard for me to eat 0 No I don't always have enough money to buy the food I need 0 No I eat alone most of the time 1 Yes I take three or more different prescribed or over-the-counter drugs a day 1 Yes Without wanting to, I have lost or gained 10 pounds in the last six months 0 No I am not always physically able to shop, cook and/or feed myself 0 No Nutrition Protocols Good Risk Protocol 0 No interventions needed Moderate Risk Protocol High Risk Proctocol Risk Level: Good Risk Score: 2 Electronic Signature(s) Signed: 04/27/2019 5:23:28 PM By: Gretta Cool, BSN,  RN, CWS, Kim RN, BSN Entered By: Gretta Cool, BSN, RN, CWS, Kim on 04/26/2019 13:37:29

## 2019-05-02 ENCOUNTER — Ambulatory Visit: Payer: PPO

## 2019-05-10 ENCOUNTER — Other Ambulatory Visit: Payer: Self-pay

## 2019-05-10 ENCOUNTER — Encounter: Payer: PPO | Attending: Physician Assistant | Admitting: Physician Assistant

## 2019-05-10 DIAGNOSIS — S81801A Unspecified open wound, right lower leg, initial encounter: Secondary | ICD-10-CM | POA: Diagnosis present

## 2019-05-10 DIAGNOSIS — L97819 Non-pressure chronic ulcer of other part of right lower leg with unspecified severity: Secondary | ICD-10-CM | POA: Diagnosis not present

## 2019-05-10 DIAGNOSIS — I10 Essential (primary) hypertension: Secondary | ICD-10-CM | POA: Diagnosis not present

## 2019-05-10 DIAGNOSIS — J449 Chronic obstructive pulmonary disease, unspecified: Secondary | ICD-10-CM | POA: Insufficient documentation

## 2019-05-10 DIAGNOSIS — F17210 Nicotine dependence, cigarettes, uncomplicated: Secondary | ICD-10-CM | POA: Diagnosis not present

## 2019-05-10 DIAGNOSIS — Z8249 Family history of ischemic heart disease and other diseases of the circulatory system: Secondary | ICD-10-CM | POA: Insufficient documentation

## 2019-05-10 DIAGNOSIS — Z09 Encounter for follow-up examination after completed treatment for conditions other than malignant neoplasm: Secondary | ICD-10-CM | POA: Diagnosis not present

## 2019-05-10 NOTE — Progress Notes (Signed)
ADYLIN, PISKE ([id number]) Visit Report for 05/10/2019 Arrival Information Details Patient Name: Sarah Cunningham, Sarah Cunningham. Date of Service: 05/10/2019 9:15 AM Medical Record Number: [id number] Patient Account Number: [id number] Date of Birth/Sex: 1946/05/14 (73 y.o. F) Treating RN: Harold Barban Primary Care Lief Palmatier: Dion Body Other Clinician: Referring Mackey Varricchio: Dion Body Treating Shristi Scheib/Extender: Melburn Hake, HOYT Weeks in Treatment: 2 Visit Information History Since Last Visit Added or deleted any medications: No Patient Arrived: Ambulatory Any new allergies or adverse reactions: No Arrival Time: 09:21 Had a fall or experienced change in No Accompanied By: self activities of daily living that may affect Transfer Assistance: None risk of falls: Patient Identification Verified: Yes Signs or symptoms of abuse/neglect since last visito No Secondary Verification Process Completed: Yes Hospitalized since last visit: No Has Dressing in Place as Prescribed: Yes Pain Present Now: No Electronic Signature(s) Signed: 05/10/2019 4:15:06 PM By: Harold Barban Entered By: Harold Barban on 05/10/2019 09:22:53 Sarah Cunningham, Sarah Cunningham ([id number]) -------------------------------------------------------------------------------- Clinic Level of Care Assessment Details Patient Name: Sarah Flake Cunningham. Date of Service: 05/10/2019 9:15 AM Medical Record Number: [id number] Patient Account Number: [id number] Date of Birth/Sex: Nov 25, 1945 (73 y.o. F) Treating RN: Army Melia Primary Care Winnifred Dufford: Dion Body Other Clinician: Referring Naftoli Penny: Dion Body Treating Shakura Cowing/Extender: Melburn Hake, HOYT Weeks in Treatment: 2 Clinic Level of Care Assessment Items TOOL 4 Quantity Score []  - Use when only an EandM is performed on FOLLOW-UP visit 0 ASSESSMENTS - Nursing Assessment / Reassessment X - Reassessment of Co-morbidities (includes updates in patient status) 1  10 X- 1 5 Reassessment of Adherence to Treatment Plan ASSESSMENTS - Wound and Skin Assessment / Reassessment X - Simple Wound Assessment / Reassessment - one wound 1 5 []  - 0 Complex Wound Assessment / Reassessment - multiple wounds []  - 0 Dermatologic / Skin Assessment (not related to wound area) ASSESSMENTS - Focused Assessment X - Circumferential Edema Measurements - multi extremities 1 5 []  - 0 Nutritional Assessment / Counseling / Intervention X- 1 5 Lower Extremity Assessment (monofilament, tuning fork, pulses) []  - 0 Peripheral Arterial Disease Assessment (using hand held doppler) ASSESSMENTS - Ostomy and/or Continence Assessment and Care []  - Incontinence Assessment and Management 0 []  - 0 Ostomy Care Assessment and Management (repouching, etc.) PROCESS - Coordination of Care X - Simple Patient / Family Education for ongoing care 1 15 []  - 0 Complex (extensive) Patient / Family Education for ongoing care X- 1 10 Staff obtains Programmer, systems, Records, Test Results / Process Orders []  - 0 Staff telephones HHA, Nursing Homes / Clarify orders / etc []  - 0 Routine Transfer to another Facility (non-emergent condition) []  - 0 Routine Hospital Admission (non-emergent condition) []  - 0 New Admissions / Biomedical engineer / Ordering NPWT, Apligraf, etc. []  - 0 Emergency Hospital Admission (emergent condition) X- 1 10 Simple Discharge Coordination Sarah Cunningham, Sarah Cunningham. ([id number]) []  - 0 Complex (extensive) Discharge Coordination PROCESS - Special Needs []  - Pediatric / Minor Patient Management 0 []  - 0 Isolation Patient Management []  - 0 Hearing / Language / Visual special needs []  - 0 Assessment of Community assistance (transportation, D/C planning, etc.) []  - 0 Additional assistance / Altered mentation []  - 0 Support Surface(s) Assessment (bed, cushion, seat, etc.) INTERVENTIONS - Wound Cleansing / Measurement X - Simple Wound Cleansing - one wound 1 5 []  -  0 Complex Wound Cleansing - multiple wounds X- 1 5 Wound Imaging (photographs - any number of wounds) []  - 0 Wound Tracing (instead of photographs) []  - 0 Simple  Wound Measurement - one wound []  - 0 Complex Wound Measurement - multiple wounds INTERVENTIONS - Wound Dressings []  - Small Wound Dressing one or multiple wounds 0 []  - 0 Medium Wound Dressing one or multiple wounds []  - 0 Large Wound Dressing one or multiple wounds []  - 0 Application of Medications - topical []  - 0 Application of Medications - injection INTERVENTIONS - Miscellaneous []  - External ear exam 0 []  - 0 Specimen Collection (cultures, biopsies, blood, body fluids, etc.) []  - 0 Specimen(s) / Culture(s) sent or taken to Lab for analysis []  - 0 Patient Transfer (multiple staff / Civil Service fast streamer / Similar devices) []  - 0 Simple Staple / Suture removal (25 or less) []  - 0 Complex Staple / Suture removal (26 or more) []  - 0 Hypo / Hyperglycemic Management (close monitor of Blood Glucose) []  - 0 Ankle / Brachial Index (ABI) - do not check if billed separately X- 1 5 Vital Signs Sarah Cunningham, Sarah Cunningham. ([id number]) Has the patient been seen at the hospital within the last three years: Yes Total Score: 80 Level Of Care: New/Established - Level 3 Electronic Signature(s) Signed: 05/10/2019 10:12:12 AM By: Army Melia Entered By: Army Melia on 05/10/2019 09:36:21 Sarah Cunningham, Sarah Cunningham ([id number]) -------------------------------------------------------------------------------- Encounter Discharge Information Details Patient Name: Sarah Flake Cunningham. Date of Service: 05/10/2019 9:15 AM Medical Record Number: [id number] Patient Account Number: [id number] Date of Birth/Sex: 11/18/1945 (73 y.o. F) Treating RN: Army Melia Primary Care Niveah Boerner: Dion Body Other Clinician: Referring Madelline Eshbach: Dion Body Treating Kinaya Hilliker/Extender: Melburn Hake, HOYT Weeks in Treatment: 2 Encounter Discharge Information  Items Discharge Condition: Stable Ambulatory Status: Ambulatory Discharge Destination: Home Transportation: Private Auto Accompanied By: self Schedule Follow-up Appointment: Yes Clinical Summary of Care: Electronic Signature(s) Signed: 05/10/2019 10:12:12 AM By: Army Melia Entered By: Army Melia on 05/10/2019 09:37:01 Sarah Cunningham, Sarah Cunningham ([id number]) -------------------------------------------------------------------------------- Lower Extremity Assessment Details Patient Name: Sarah Cunningham, Sarah Cunningham. Date of Service: 05/10/2019 9:15 AM Medical Record Number: [id number] Patient Account Number: [id number] Date of Birth/Sex: [birth year] (73 y.o. F) Treating RN: Harold Barban Primary Care Reather Steller: Dion Body Other Clinician: Referring Adante Courington: Dion Body Treating Daiel Strohecker/Extender: Melburn Hake, HOYT Weeks in Treatment: 2 Vascular Assessment Pulses: Dorsalis Pedis Palpable: [Right:Yes] Posterior Tibial Palpable: [Right:Yes] Electronic Signature(s) Signed: 05/10/2019 4:15:06 PM By: Harold Barban Entered By: Harold Barban on 05/10/2019 09:26:21 Sarah Cunningham, Sarah Cunningham ([id number]) -------------------------------------------------------------------------------- Multi Wound Chart Details Patient Name: Sarah Cunningham, Sarah Cunningham. Date of Service: 05/10/2019 9:15 AM Medical Record Number: [id number] Patient Account Number: [id number] Date of Birth/Sex: [birth year] (72 y.o. F) Treating RN: Army Melia Primary Care Areej Tayler: Dion Body Other Clinician: Referring Houston Zapien: Dion Body Treating Shuntae Herzig/Extender: Melburn Hake, HOYT Weeks in Treatment: 2 Vital Signs Height(in): 66 Pulse(bpm): 38 Weight(lbs): 138 Blood Pressure(mmHg): 136/79 Body Mass Index(BMI): 22 Temperature(F): 98.0 Respiratory Rate 16 (breaths/min): Photos: [N/A:N/A] Wound Location: Right Lower Leg - Medial N/A N/A Wounding Event: Trauma N/A N/A Primary Etiology: Trauma, Other N/A  N/A Comorbid History: Cataracts, Chronic Obstructive N/A N/A Pulmonary Disease (COPD), Hypertension Date Acquired: 04/06/2019 N/A N/A Weeks of Treatment: 2 N/A N/A Wound Status: Open N/A N/A Measurements Cunningham x W x D 0x0x0 N/A N/A (cm) Area (cm) : 0 N/A N/A Volume (cm) : 0 N/A N/A % Reduction in Area: 100.00% N/A N/A % Reduction in Volume: 100.00% N/A N/A Classification: Unclassifiable N/A N/A Exudate Amount: None Present N/A N/A Wound Margin: Flat and Intact N/A N/A Granulation Amount: None Present (0%) N/A N/A Necrotic Amount: None Present (0%) N/A N/A Exposed Structures: Fascia: No N/A N/A  Fat Layer (Subcutaneous Tissue) Exposed: No Tendon: No Muscle: No Joint: No Bone: No Epithelialization: Large (67-100%) N/A N/A NOTASHA, ALERS ([id number]) Treatment Notes Electronic Signature(s) Signed: 05/10/2019 10:12:12 AM By: Army Melia Entered By: Army Melia on 05/10/2019 09:35:08 Sarah Cunningham, Sarah Cunningham ([id number]) -------------------------------------------------------------------------------- [place] Details Patient Name: Sarah Flake Cunningham. Date of Service: 05/10/2019 9:15 AM Medical Record Number: [id number] Patient Account Number: [id number] Date of Birth/Sex: 08/13/1945 (73 y.o. F) Treating RN: Army Melia Primary Care Amerika Nourse: Dion Body Other Clinician: Referring Chemeka Filice: Dion Body Treating Sherilynn Dieu/Extender: Melburn Hake, HOYT Weeks in Treatment: 2 Active Inactive Electronic Signature(s) Signed: 05/10/2019 10:12:12 AM By: Army Melia Entered By: Army Melia on 05/10/2019 09:35:02 Sarah Cunningham, Sarah Cunningham ([id number]) -------------------------------------------------------------------------------- Pain Assessment Details Patient Name: Sarah Cunningham, Sarah Cunningham. Date of Service: 05/10/2019 9:15 AM Medical Record Number: [id number] Patient Account Number: [id number] Date of Birth/Sex: [birth year] (73 y.o. F) Treating RN: Harold Barban Primary Care Erice Ahles: Dion Body Other Clinician: Referring Daemyn Gariepy: Dion Body Treating Brelyn Woehl/Extender: Melburn Hake, HOYT Weeks in Treatment: 2 Active Problems Location of Pain Severity and Description of Pain Patient Has Paino No Site Locations Pain Management and Medication Current Pain Management: Electronic Signature(s) Signed: 05/10/2019 4:15:06 PM By: Harold Barban Entered By: Harold Barban on 05/10/2019 09:23:05 [place], Sarah Cunningham ([id number]) -------------------------------------------------------------------------------- Patient/Caregiver Education Details Patient Name: Sarah Flake Cunningham. Date of Service: 05/10/2019 9:15 AM Medical Record Number: [id number] Patient Account Number: [id number] Date of Birth/Gender: 03/02/1946 (73 y.o. F) Treating RN: Army Melia Primary Care Physician: Dion Body Other Clinician: Referring Physician: Dion Body Treating Physician/Extender: Sharalyn Ink in Treatment: 2 Education Assessment Education Provided To: Patient Education Topics Provided Wound/Skin Impairment: Handouts: Caring for Your Ulcer Methods: Demonstration, Explain/Verbal Responses: State content correctly Electronic Signature(s) Signed: 05/10/2019 10:12:12 AM By: Army Melia Entered By: Army Melia on 05/10/2019 09:36:45 [place], [place] ([id number]) -------------------------------------------------------------------------------- Wound Assessment Details Patient Name: Coon, Layce Cunningham. Date of Service: 05/10/2019 9:15 AM Medical Record Number: [id number] Patient Account Number: [id number] Date of Birth/Sex: [birth year] (73 y.o. F) Treating RN: Harold Barban Primary Care Isella Slatten: Dion Body Other Clinician: Referring Moorea Boissonneault: Dion Body Treating Glynda Soliday/Extender: Melburn Hake, HOYT Weeks in Treatment: 2 Wound Status Wound Number: 1 Primary Trauma, Other Etiology: Wound Location: Right  Lower Leg - Medial Wound Open Wounding Event: Trauma Status: Date Acquired: 04/06/2019 Comorbid Cataracts, Chronic Obstructive Pulmonary Weeks Of Treatment: 2 History: Disease (COPD), Hypertension Clustered Wound: No Photos Wound Measurements Length: (cm) 0 % Width: (cm) 0 % Depth: (cm) 0 Ep Area: (cm) 0 T Volume: (cm) 0 U Reduction in Area: 100% Reduction in Volume: 100% ithelialization: Large (67-100%) unneling: No ndermining: No Wound Description Classification: Unclassifiable Wound Margin: Flat and Intact Exudate Amount: None Present Foul Odor After Cleansing: No Slough/Fibrino No Wound Bed Granulation Amount: None Present (0%) Exposed Structure Necrotic Amount: None Present (0%) Fascia Exposed: No Fat Layer (Subcutaneous Tissue) Exposed: No Tendon Exposed: No Muscle Exposed: No Joint Exposed: No Bone Exposed: No Electronic Signature(s) Signed: 05/10/2019 4:15:06 PM By: Harold Barban Entered By: Harold Barban on 05/10/2019 09:25:57 Starner, Sarah Cunningham ([id number]) Amacher, Bleu Cunningham. ([id number]) -------------------------------------------------------------------------------- Vitals Details Patient Name: Sago, Lailah Cunningham. Date of Service: 05/10/2019 9:15 AM Medical Record Number: [id number] Patient Account Number: [id number] Date of Birth/Sex: February 05, 1946 (73 y.o. F) Treating RN: Harold Barban Primary Care Ronie Barnhart: Dion Body Other Clinician: Referring Orell Hurtado: Dion Body Treating Toshiro Hanken/Extender: Melburn Hake, HOYT Weeks in Treatment: 2 Vital Signs Time Taken: 09:25 Temperature (F): 98.0 Height (in): 66 Pulse (bpm): 88 Weight (lbs): 138  Respiratory Rate (breaths/min): 16 Body Mass Index (BMI): 22.3 Blood Pressure (mmHg): 136/79 Reference Range: 80 - 120 mg / dl Electronic Signature(s) Signed: 05/10/2019 4:15:06 PM By: Harold Barban Entered By: Harold Barban on 05/10/2019 09:26:43

## 2019-05-10 NOTE — Progress Notes (Addendum)
DORRIAN, SHOOK ([id number]) Visit Report for 05/10/2019 Chief Complaint Document Details Patient Name: Sarah Cunningham, Sarah Cunningham. Date of Service: 05/10/2019 9:15 AM Medical Record Number: [id number] Patient Account Number: [id number] Date of Birth/Sex: [birth year] (73 y.o. F) Treating RN: Army Melia Primary Care Provider: Dion Body Other Clinician: Referring Provider: Dion Body Treating Provider/Extender: Melburn Hake, HOYT Weeks in Treatment: 2 Information Obtained from: Patient Chief Complaint Right LE Ulcer Electronic Signature(s) Signed: 05/10/2019 9:27:57 AM By: Worthy Keeler PA-C Entered By: Worthy Keeler on 05/10/2019 09:27:57 Souter, Sussan L. ([id number]) -------------------------------------------------------------------------------- HPI Details Patient Name: [name] Date of Service: 05/10/2019 9:15 AM Medical Record Number: [id number] Patient Account Number: [id number] Date of Birth/Sex: 14-Feb-1946 (73 y.o. F) Treating RN: Army Melia Primary Care Provider: Dion Body Other Clinician: Referring Provider: Dion Body Treating Provider/Extender: Melburn Hake, HOYT Weeks in Treatment: 2 History of Present Illness HPI Description: 04/26/2019 upon evaluation today patient actually appears to be doing actually better she tells me than what she has been over the past couple of weeks. She had an injury to her lower extremity on the left and subsequently this was a fairly significant laceration/skin tear that she was quite concerned about. She went to urgent care and they treated this initially with Silvadene which really did not do very well for her she tells me. Subsequently she ended up having to go back to urgent care at which point they placed her on clindamycin which she is done with at this point. Subsequently they also went ahead and recommended that she pretty much clean the area and leave this open to air and since that time it has  been drying out and doing significantly better. The patient tells me that "I am really not sure why I am here as this already seems to be feeling better". Nonetheless she did come in for evaluation today. Patient does have a history of hypertension as well as COPD but appears to be in good health at the moment with no complications. There is no sign of local or systemic infection. No fevers, chills, nausea, vomiting, or diarrhea 05/10/2019 on evaluation today patient actually appears to be completely healed which is excellent news. Fortunately there is no evidence of active infection and overall she has done quite well with the treatment up to this point. No fevers, chills, nausea, vomiting, or diarrhea. Electronic Signature(s) Signed: 05/10/2019 9:37:12 AM By: Worthy Keeler PA-C Entered By: Worthy Keeler on 05/10/2019 09:37:11 Morga, [name] ([id number]) -------------------------------------------------------------------------------- Physical Exam Details Patient Name: Keepers, Rehana L. Date of Service: 05/10/2019 9:15 AM Medical Record Number: [id number] Patient Account Number: [id number] Date of Birth/Sex: Nov 18, 1945 (73 y.o. F) Treating RN: Army Melia Primary Care Provider: Dion Body Other Clinician: Referring Provider: Dion Body Treating Provider/Extender: Melburn Hake, HOYT Weeks in Treatment: 2 Constitutional Well-nourished and well-hydrated in no acute distress. Respiratory normal breathing without difficulty. Psychiatric this patient is able to make decisions and demonstrates good insight into disease process. Alert and Oriented x 3. pleasant and cooperative. Notes Patient's wound bed again shows complete epithelization based on what I am seeing today and I am very pleased in this regard. There is no signs of anything worsening at this point. Overall I think she is ready for discharge I do recommend lotion to the lower extremity. Electronic  Signature(s) Signed: 05/10/2019 9:38:11 AM By: Worthy Keeler PA-C Entered By: Worthy Keeler on 05/10/2019 09:38:10 Carlin, [name] ([id number]) -------------------------------------------------------------------------------- Physician Orders Details Patient Name: [name] Date  of Service: 05/10/2019 9:15 AM Medical Record Number: [id number] Patient Account Number: [id number] Date of Birth/Sex: 02/25/1946 (73 y.o. F) Treating RN: Army Melia Primary Care Provider: Dion Body Other Clinician: Referring Provider: Dion Body Treating Provider/Extender: Melburn Hake, HOYT Weeks in Treatment: 2 Verbal / Phone Orders: No Diagnosis Coding ICD-10 Coding Code Description S81.801A Unspecified open wound, right lower leg, initial encounter L97.818 Non-pressure chronic ulcer of other part of right lower leg with other specified severity I10 Essential (primary) hypertension J44.9 Chronic obstructive pulmonary disease, unspecified Discharge From [facility] o Discharge from [place] complete Electronic Signature(s) Signed: 05/10/2019 10:12:12 AM By: Army Melia Signed: 05/10/2019 6:06:55 PM By: Worthy Keeler PA-C Entered By: Army Melia on 05/10/2019 09:35:25 Glendening, Samamtha L. ([id number]) -------------------------------------------------------------------------------- Problem List Details Patient Name: Monrreal, Journee L. Date of Service: 05/10/2019 9:15 AM Medical Record Number: [id number] Patient Account Number: [id number] Date of Birth/Sex: [birth year] (73 y.o. F) Treating RN: Army Melia Primary Care Provider: Dion Body Other Clinician: Referring Provider: Dion Body Treating Provider/Extender: Melburn Hake, HOYT Weeks in Treatment: 2 Active Problems ICD-10 Evaluated Encounter Code Description Active Date Today Diagnosis S81.801A Unspecified open wound, right lower leg, initial encounter 04/26/2019 No Yes L97.818  Non-pressure chronic ulcer of other part of right lower leg 04/26/2019 No Yes with other specified severity I10 Essential (primary) hypertension 04/26/2019 No Yes J44.9 Chronic obstructive pulmonary disease, unspecified 04/26/2019 No Yes Inactive Problems Resolved Problems Electronic Signature(s) Signed: 05/10/2019 9:27:50 AM By: Worthy Keeler PA-C Entered By: Worthy Keeler on 05/10/2019 09:27:49 Beauchamp, Dmiya L. ([id number]) -------------------------------------------------------------------------------- Progress Note Details Patient Name: Sanderson, Taylen L. Date of Service: 05/10/2019 9:15 AM Medical Record Number: [id number] Patient Account Number: [id number] Date of Birth/Sex: 09/08/1945 (73 y.o. F) Treating RN: Army Melia Primary Care Provider: Dion Body Other Clinician: Referring Provider: Dion Body Treating Provider/Extender: Melburn Hake, HOYT Weeks in Treatment: 2 Subjective Chief Complaint Information obtained from Patient Right LE Ulcer History of Present Illness (HPI) 04/26/2019 upon evaluation today patient actually appears to be doing actually better she tells me than what she has been over the past couple of weeks. She had an injury to her lower extremity on the left and subsequently this was a fairly significant laceration/skin tear that she was quite concerned about. She went to urgent care and they treated this initially with Silvadene which really did not do very well for her she tells me. Subsequently she ended up having to go back to urgent care at which point they placed her on clindamycin which she is done with at this point. Subsequently they also went ahead and recommended that she pretty much clean the area and leave this open to air and since that time it has been drying out and doing significantly better. The patient tells me that "I am really not sure why I am here as this already seems to be feeling better". Nonetheless she did come  in for evaluation today. Patient does have a history of hypertension as well as COPD but appears to be in good health at the moment with no complications. There is no sign of local or systemic infection. No fevers, chills, nausea, vomiting, or diarrhea 05/10/2019 on evaluation today patient actually appears to be completely healed which is excellent news. Fortunately there is no evidence of active infection and overall she has done quite well with the treatment up to this point. No fevers, chills, nausea, vomiting, or diarrhea. Patient History Information obtained from Patient. Family History Cancer -  Father,Mother, Diabetes - Siblings, Heart Disease - Maternal Grandparents,Paternal Grandparents, Kidney Disease - Siblings. Social History Current every day smoker - 1/2 pack daily, Marital Status - Widowed, Alcohol Use - Rarely, Drug Use - No History, Caffeine Use - Daily. Medical History Eyes Patient has history of Cataracts - 2017 Bilateral removal Respiratory Patient has history of Chronic Obstructive Pulmonary Disease (COPD) Cardiovascular Patient has history of Hypertension Medical And Surgical History Notes Musculoskeletal Arthritis-Right Hip, Osteoporosis Review of Systems (ROS) Constitutional Symptoms (General Health) Christensen, Logan ([id number]) Denies complaints or symptoms of Fatigue, Fever, Chills, Marked Weight Change. Respiratory Denies complaints or symptoms of Chronic or frequent coughs, Shortness of Breath. Cardiovascular Denies complaints or symptoms of Chest pain, LE edema. Psychiatric Denies complaints or symptoms of Anxiety, Claustrophobia. Objective Constitutional Well-nourished and well-hydrated in no acute distress. Vitals Time Taken: 9:25 AM, Height: 66 in, Weight: 138 lbs, BMI: 22.3, Temperature: 98.0 F, Pulse: 88 bpm, Respiratory Rate: 16 breaths/min, Blood Pressure: 136/79 mmHg. Respiratory normal breathing without difficulty. Psychiatric this  patient is able to make decisions and demonstrates good insight into disease process. Alert and Oriented x 3. pleasant and cooperative. General Notes: Patient's wound bed again shows complete epithelization based on what I am seeing today and I am very pleased in this regard. There is no signs of anything worsening at this point. Overall I think she is ready for discharge I do recommend lotion to the lower extremity. Integumentary (Hair, Skin) Wound #1 status is Open. Original cause of wound was Trauma. The wound is located on the Right,Medial Lower Leg. The wound measures 0cm length x 0cm width x 0cm depth; 0cm^2 area and 0cm^3 volume. There is no tunneling or undermining noted. There is a none present amount of drainage noted. The wound margin is flat and intact. There is no granulation within the wound bed. There is no necrotic tissue within the wound bed. Assessment Active Problems ICD-10 Unspecified open wound, right lower leg, initial encounter Non-pressure chronic ulcer of other part of right lower leg with other specified severity Essential (primary) hypertension Chronic obstructive pulmonary disease, unspecified Lightsey, Jessicca L. ([id number]) Plan Discharge From [facility] Services: Discharge from [place] complete 1. I would recommend currently that we go ahead and continue with the recommendation for lotion to the lower extremities I would recommend she do this at least 2-3 times a day in order to help with the dry skin even over the healed wound area. 2. She really does not have any swelling or edema that is good news so I think that she is really not absolutely required to use any compression stockings at this point. Patient will follow up in the clinic as needed if anything changes or worsens. Electronic Signature(s) Signed: 05/10/2019 9:38:24 AM By: Worthy Keeler PA-C Entered By: Worthy Keeler on 05/10/2019 09:38:24 [name]  ([id number]) -------------------------------------------------------------------------------- ROS/PFSH Details Patient Name: [name] Date of Service: 05/10/2019 9:15 AM Medical Record Number: [id number] Patient Account Number: [id number] Date of Birth/Sex: January 11, 1946 (73 y.o. F) Treating RN: Army Melia Primary Care Provider: Dion Body Other Clinician: Referring Provider: Dion Body Treating Provider/Extender: Melburn Hake, HOYT Weeks in Treatment: 2 Information Obtained From Patient Constitutional Symptoms (General Health) Complaints and Symptoms: Negative for: Fatigue; Fever; Chills; Marked Weight Change Respiratory Complaints and Symptoms: Negative for: Chronic or frequent coughs; Shortness of Breath Medical History: Positive for: Chronic Obstructive Pulmonary Disease (COPD) Cardiovascular Complaints and Symptoms: Negative for: Chest pain; LE edema Medical History: Positive  for: Hypertension Psychiatric Complaints and Symptoms: Negative for: Anxiety; Claustrophobia Eyes Medical History: Positive for: Cataracts - 2017 Bilateral removal Musculoskeletal Medical History: Past Medical History Notes: Arthritis-Right Hip, Osteoporosis HBO Extended History Items Eyes: Cataracts Immunizations Pneumococcal Vaccine: Received Pneumococcal Vaccination: Yes Implantable Devices CHAKAKHAN, HANNEGAN ([id number]) None Family and Social History Cancer: Yes - Father,Mother; Diabetes: Yes - Siblings; Heart Disease: Yes - Maternal Grandparents,Paternal Grandparents; Kidney Disease: Yes - Siblings; Current every day smoker - 1/2 pack daily; Marital Status - Widowed; Alcohol Use: Rarely; Drug Use: No History; Caffeine Use: Daily Physician Affirmation I have reviewed and agree with the above information. Electronic Signature(s) Signed: 05/10/2019 10:12:12 AM By: Army Melia Signed: 05/10/2019 6:06:55 PM By: Worthy Keeler PA-C Entered By: Worthy Keeler on  05/10/2019 09:37:39 Crunk, Fatim Carlean Jews ([id number]) -------------------------------------------------------------------------------- SuperBill Details Patient Name: [name] Date of Service: 05/10/2019 Medical Record Number: [id number] Patient Account Number: [id number] Date of Birth/Sex: 06/06/1946 (73 y.o. F) Treating RN: Army Melia Primary Care Provider: Dion Body Other Clinician: Referring Provider: Dion Body Treating Provider/Extender: Melburn Hake, HOYT Weeks in Treatment: 2 Diagnosis Coding ICD-10 Codes Code Description S81.801A Unspecified open wound, right lower leg, initial encounter L97.818 Non-pressure chronic ulcer of other part of right lower leg with other specified severity I10 Essential (primary) hypertension J44.9 Chronic obstructive pulmonary disease, unspecified Facility Procedures CPT4 Code: [id number] Description: [id number] - WOUND CARE VISIT-LEV 3 EST PT Modifier: Quantity: 1 Physician Procedures CPT4 Code Description: [id number] - WC PHYS LEVEL 3 - EST PT ICD-10 Diagnosis Description S81.801A Unspecified open wound, right lower leg, initial encounter L97.818 Non-pressure 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

## 2019-05-14 DIAGNOSIS — G500 Trigeminal neuralgia: Secondary | ICD-10-CM | POA: Diagnosis not present

## 2019-06-06 ENCOUNTER — Ambulatory Visit
Admission: RE | Admit: 2019-06-06 | Discharge: 2019-06-06 | Disposition: A | Discharge status: Home / Self Care | Payer: PPO | Source: Ambulatory Visit | Attending: Family Medicine | Admitting: Family Medicine

## 2019-06-06 DIAGNOSIS — Z1231 Encounter for screening mammogram for malignant neoplasm of breast: Secondary | ICD-10-CM | POA: Diagnosis not present

## 2019-06-06 DIAGNOSIS — G500 Trigeminal neuralgia: Secondary | ICD-10-CM | POA: Diagnosis not present

## 2019-06-06 DIAGNOSIS — R6884 Jaw pain: Secondary | ICD-10-CM | POA: Insufficient documentation

## 2019-06-07 ENCOUNTER — Other Ambulatory Visit: Payer: Self-pay

## 2019-06-07 ENCOUNTER — Other Ambulatory Visit: Payer: Self-pay | Admitting: Neurology

## 2019-06-07 ENCOUNTER — Ambulatory Visit
Admission: RE | Admit: 2019-06-07 | Discharge: 2019-06-07 | Disposition: A | Discharge status: Home / Self Care | Payer: PPO | Source: Ambulatory Visit | Attending: Neurology | Admitting: Neurology

## 2019-06-07 DIAGNOSIS — G500 Trigeminal neuralgia: Secondary | ICD-10-CM | POA: Diagnosis not present

## 2019-06-07 DIAGNOSIS — R6884 Jaw pain: Secondary | ICD-10-CM | POA: Diagnosis not present

## 2019-06-07 LAB — POCT I-STAT CREATININE: Creatinine, Ser: 0.8 mg/dL (ref 0.44–1.00)

## 2019-06-07 MED ORDER — GADOBUTROL 1 MMOL/ML IV SOLN
6.0000 mL | Freq: Once | INTRAVENOUS | Status: AC | PRN
  Administered 2019-06-07: 6 mL via INTRAVENOUS

## 2019-08-31 DIAGNOSIS — G500 Trigeminal neuralgia: Secondary | ICD-10-CM | POA: Diagnosis not present

## 2019-08-31 DIAGNOSIS — E559 Vitamin D deficiency, unspecified: Secondary | ICD-10-CM | POA: Diagnosis not present

## 2019-08-31 DIAGNOSIS — E538 Deficiency of other specified B group vitamins: Secondary | ICD-10-CM | POA: Diagnosis not present

## 2019-08-31 DIAGNOSIS — Z79899 Other long term (current) drug therapy: Secondary | ICD-10-CM | POA: Diagnosis not present

## 2019-08-31 DIAGNOSIS — I6381 Other cerebral infarction due to occlusion or stenosis of small artery: Secondary | ICD-10-CM | POA: Diagnosis not present

## 2019-10-02 DIAGNOSIS — K1370 Unspecified lesions of oral mucosa: Secondary | ICD-10-CM | POA: Diagnosis not present

## 2019-10-31 DIAGNOSIS — I10 Essential (primary) hypertension: Secondary | ICD-10-CM | POA: Diagnosis not present

## 2019-10-31 DIAGNOSIS — E041 Nontoxic single thyroid nodule: Secondary | ICD-10-CM | POA: Diagnosis not present

## 2019-10-31 DIAGNOSIS — Z0000 Encounter for general adult medical examination without abnormal findings: Secondary | ICD-10-CM | POA: Diagnosis not present

## 2019-10-31 DIAGNOSIS — E7800 Pure hypercholesterolemia, unspecified: Secondary | ICD-10-CM | POA: Diagnosis not present

## 2019-10-31 DIAGNOSIS — F17200 Nicotine dependence, unspecified, uncomplicated: Secondary | ICD-10-CM | POA: Diagnosis not present

## 2019-11-06 DIAGNOSIS — M79674 Pain in right toe(s): Secondary | ICD-10-CM | POA: Diagnosis not present

## 2019-11-06 DIAGNOSIS — M79675 Pain in left toe(s): Secondary | ICD-10-CM | POA: Diagnosis not present

## 2019-11-06 DIAGNOSIS — B351 Tinea unguium: Secondary | ICD-10-CM | POA: Diagnosis not present

## 2019-11-06 DIAGNOSIS — L600 Ingrowing nail: Secondary | ICD-10-CM | POA: Diagnosis not present

## 2019-12-27 DIAGNOSIS — K1321 Leukoplakia of oral mucosa, including tongue: Secondary | ICD-10-CM | POA: Diagnosis not present

## 2019-12-27 DIAGNOSIS — T148XXA Other injury of unspecified body region, initial encounter: Secondary | ICD-10-CM | POA: Diagnosis not present

## 2020-04-03 DIAGNOSIS — I6381 Other cerebral infarction due to occlusion or stenosis of small artery: Secondary | ICD-10-CM | POA: Diagnosis not present

## 2020-04-03 DIAGNOSIS — H26491 Other secondary cataract, right eye: Secondary | ICD-10-CM | POA: Diagnosis not present

## 2020-04-03 DIAGNOSIS — G500 Trigeminal neuralgia: Secondary | ICD-10-CM | POA: Diagnosis not present

## 2020-04-24 DIAGNOSIS — F17200 Nicotine dependence, unspecified, uncomplicated: Secondary | ICD-10-CM | POA: Diagnosis not present

## 2020-04-24 DIAGNOSIS — I10 Essential (primary) hypertension: Secondary | ICD-10-CM | POA: Diagnosis not present

## 2020-04-24 DIAGNOSIS — E7800 Pure hypercholesterolemia, unspecified: Secondary | ICD-10-CM | POA: Diagnosis not present

## 2020-04-24 DIAGNOSIS — H26491 Other secondary cataract, right eye: Secondary | ICD-10-CM | POA: Diagnosis not present

## 2020-05-02 ENCOUNTER — Encounter: Payer: Self-pay | Admitting: Emergency Medicine

## 2020-05-02 ENCOUNTER — Emergency Department
Admission: EM | Admit: 2020-05-02 | Discharge: 2020-05-02 | Disposition: A | Discharge status: Home / Self Care | Payer: PPO | Attending: Emergency Medicine | Admitting: Emergency Medicine

## 2020-05-02 ENCOUNTER — Emergency Department: Payer: PPO

## 2020-05-02 ENCOUNTER — Other Ambulatory Visit: Payer: Self-pay

## 2020-05-02 DIAGNOSIS — M47816 Spondylosis without myelopathy or radiculopathy, lumbar region: Secondary | ICD-10-CM | POA: Diagnosis not present

## 2020-05-02 DIAGNOSIS — S76312A Strain of muscle, fascia and tendon of the posterior muscle group at thigh level, left thigh, initial encounter: Secondary | ICD-10-CM | POA: Diagnosis not present

## 2020-05-02 DIAGNOSIS — I10 Essential (primary) hypertension: Secondary | ICD-10-CM | POA: Diagnosis not present

## 2020-05-02 DIAGNOSIS — M1611 Unilateral primary osteoarthritis, right hip: Secondary | ICD-10-CM | POA: Diagnosis not present

## 2020-05-02 DIAGNOSIS — M25552 Pain in left hip: Secondary | ICD-10-CM | POA: Diagnosis present

## 2020-05-02 DIAGNOSIS — M1612 Unilateral primary osteoarthritis, left hip: Secondary | ICD-10-CM | POA: Diagnosis not present

## 2020-05-02 DIAGNOSIS — F17200 Nicotine dependence, unspecified, uncomplicated: Secondary | ICD-10-CM | POA: Diagnosis not present

## 2020-05-02 DIAGNOSIS — M87052 Idiopathic aseptic necrosis of left femur: Secondary | ICD-10-CM | POA: Insufficient documentation

## 2020-05-02 DIAGNOSIS — M8789 Other osteonecrosis, multiple sites: Secondary | ICD-10-CM | POA: Diagnosis not present

## 2020-05-02 DIAGNOSIS — M8700 Idiopathic aseptic necrosis of unspecified bone: Secondary | ICD-10-CM

## 2020-05-02 DIAGNOSIS — M25452 Effusion, left hip: Secondary | ICD-10-CM | POA: Diagnosis not present

## 2020-05-02 DIAGNOSIS — R600 Localized edema: Secondary | ICD-10-CM | POA: Diagnosis not present

## 2020-05-02 DIAGNOSIS — M879 Osteonecrosis, unspecified: Secondary | ICD-10-CM | POA: Diagnosis not present

## 2020-05-02 DIAGNOSIS — Z79899 Other long term (current) drug therapy: Secondary | ICD-10-CM | POA: Insufficient documentation

## 2020-05-02 HISTORY — DX: Essential (primary) hypertension: I10

## 2020-05-02 MED ORDER — LIDOCAINE 5 % EX PTCH
1.0000 | MEDICATED_PATCH | CUTANEOUS | Status: DC
  Administered 2020-05-02: 1 via TRANSDERMAL
  Filled 2020-05-02: qty 1

## 2020-05-02 MED ORDER — TRAMADOL HCL 50 MG PO TABS: 50.0000 mg | ORAL_TABLET | Freq: Two times a day (BID) | ORAL | 0 refills | Status: DC | PRN

## 2020-05-02 MED ORDER — LIDOCAINE 5 % EX PTCH: 1.0000 | MEDICATED_PATCH | Freq: Two times a day (BID) | CUTANEOUS | 0 refills | Status: AC

## 2020-05-02 NOTE — ED Triage Notes (Signed)
Pt via POV. Pt reports mild hip pain for 10 days, progressively getting worse. Pt st c/o left hip pain "so painful that I cannot walk".   Pt describes  pain as sharp- pain travels down to left knee and to foot as well.   Pt unable to bare weight on left leg since yesterday.  Denies surgery/recent illness/injury/falls.   Pt st taking ibuprofen with some relief for mild pain but no relief when pain is severe.

## 2020-05-02 NOTE — ED Notes (Signed)
Pt taken by MRI at this time

## 2020-05-02 NOTE — ED Provider Notes (Signed)
[facility] Emergency Department Provider Note   ____________________________________________   First MD Initiated Contact with Patient 05/02/20 1336     (approximate)  I have reviewed the triage vital signs and the nursing notes.   HISTORY  Chief Complaint Hip Pain    HPI Sarah Cunningham is a 74 y.o. female patient complaining of increasing left hip pain for 10 days.    State pain is increased so she is unable to bear weight with the left hip.  Patient has history of osteopenia.  Rates her pain as a 9/10.  Described pain as "achy".  No palliative measure for complaint.   Past Medical History:  Diagnosis Date  . Hypertension     Patient Active Problem List   Diagnosis Date Noted  . Trigeminal neuralgia 11/19/2016  . Osteopenia of multiple sites 10/29/2016  . Tobacco dependence 10/22/2016  . Essential hypertension 04/09/2016  . Pure hypercholesterolemia 04/09/2016  . Vaccine counseling 04/09/2016    Past Surgical History:  Procedure Laterality Date  . APPENDECTOMY      Prior to Admission medications   Medication Sig Start Date End Date Taking? Authorizing Provider  albuterol (PROVENTIL HFA;VENTOLIN HFA) 108 (90 Base) MCG/ACT inhaler Inhale into the lungs.    [provider]  alendronate (FOSAMAX) 70 MG tablet Take by mouth. 06/02/16   [provider]  amLODipine (NORVASC) 5 MG tablet Take by mouth. 05/26/16   [provider]  atorvastatin (LIPITOR) 40 MG tablet TAKE ONE TABLET BY MOUTH DAILY 09/08/16   [provider]  Calcium Carbonate-Vitamin D 600-400 MG-UNIT tablet Take by mouth.    [provider]  lamoTRIgine (LAMICTAL) 100 MG tablet  10/04/16   [provider]  lidocaine (LIDODERM) 5 % Place 1 patch onto the skin every 12 (twelve) hours. Remove & Discard patch within 12 hours or as directed by MD 05/02/20 05/02/21  Sable Feil, PA-C  Multiple Vitamin (MULTI-VITAMINS) TABS Take  by mouth.    [provider]  perindopril (ACEON) 4 MG tablet Take by mouth. 05/26/16   [provider]  traMADol (ULTRAM) 50 MG tablet Take 1 tablet (50 mg total) by mouth every 12 (twelve) hours as needed. 05/02/20   Sable Feil, PA-C  Turmeric Curcumin 500 MG CAPS Take by mouth.    [provider]    Allergies Carbamazepine  History reviewed. No pertinent family history.  Social History Social History   Tobacco Use  . Smoking status: Current Every Day Smoker    Years: 40.00  . Smokeless tobacco: Never Used  Vaping Use  . Vaping Use: Never used  Substance Use Topics  . Alcohol use: No  . Drug use: Not on file    Review of Systems Constitutional: No fever/chills Eyes: No visual changes. ENT: No sore throat. Cardiovascular: Denies chest pain. Respiratory: Denies shortness of breath. Gastrointestinal: No abdominal pain.  No nausea, no vomiting.  No diarrhea.  No constipation. Genitourinary: Negative for dysuria. Musculoskeletal: Left hip pain. Skin: Negative for rash. Neurological: Negative for headaches, focal weakness or numbness. Endocrine:  Hypertension and hyperlipidemia.  ____________________________________________   PHYSICAL EXAM:  VITAL SIGNS: ED Triage Vitals  Enc Vitals Group     BP 05/02/20 1254 130/78     Pulse Rate 05/02/20 1254 77     Resp 05/02/20 1254 19     Temp 05/02/20 1254 98.5 F (36.9 C)     Temp Source 05/02/20 1254 Oral     SpO2  05/02/20 1254 96 %     Weight 05/02/20 1302 123 lb (55.8 kg)     Height 05/02/20 1302 5' 6.5" (1.689 m)     Head Circumference --      Peak Flow --      Pain Score 05/02/20 1301 9     Pain Loc --      Pain Edu? --      Excl. in [place]? --     Constitutional: Alert and oriented. Well appearing and in no acute distress. Hematological/Lymphatic/Immunilogical: No cervical lymphadenopathy. Cardiovascular: Normal rate, regular rhythm. Grossly normal heart sounds.  Good peripheral  circulation. Respiratory: Normal respiratory effort.  No retractions. Lungs CTAB. Gastrointestinal: Soft and nontender. No distention. No abdominal bruits. No CVA tenderness. Genitourinary: Deferred Musculoskeletal: No obvious deformity to the left hip.  No leg length discrepancy.  Patient is moderate guarding palpation of the greater trochanter.  Patient unable to bear weight on the left lower extremity. Neurologic:  Normal speech and language. No gross focal neurologic deficits are appreciated. No gait instability. Skin:  Skin is warm, dry and intact. No rash noted.  No abrasion or ecchymosis. Psychiatric: Mood and affect are normal. Speech and behavior are normal.  ____________________________________________   LABS (all labs ordered are listed, but only abnormal results are displayed)  Labs Reviewed - No data to display ____________________________________________  EKG   ____________________________________________  RADIOLOGY I, Sable Feil, personally viewed and evaluated these images (plain radiographs) as part of my medical decision making, as well as reviewing the written report by the radiologist.  ED MD interpretation:   Mildly mild degenerative changes bilateral hip with suggestion of AVN.  Official radiology report(s): MR HIP RIGHT WO CONTRAST  Result Date: 05/02/2020 CLINICAL DATA:  Bilateral hip pain for 10 days EXAM: MR OF THE RIGHT HIP WITHOUT CONTRAST MRI OF THE LEFT HIP WITHOUT CONTRAST TECHNIQUE: Multiplanar, multisequence MR imaging was performed. No intravenous contrast was administered. COMPARISON:  X-ray 05/02/2020 FINDINGS: RIGHT HIP: Bones: No acute fracture. No dislocation. There is a small focus of avascular necrosis involving the superior aspect of the right femoral head (series 8, image 25) with minimal adjacent T2 marrow hyperintensity. There is subtle flattening of the femoral head contour. Remainder of the proximal right femur is intact. Right  hemipelvis is within normal limits. No additional areas of marrow signal abnormality. No suspicious marrow replacing lesion. Articular cartilage and labrum Articular cartilage: Areas of full-thickness chondral fissuring along the superior femoral head overlying the site of AVN (series 11, images 18-19). There is also high-grade chondral loss within the anterosuperior acetabulum with associated subchondral cystic changes. Labrum:  Superior labral degeneration. Joint or bursal effusion Joint effusion:  No joint effusion. Bursae: No abnormal bursal fluid collection. Muscles and tendons Muscles and tendons: Low-grade insertional tears of the right gluteus minimus tendon. Gluteus medius tendon intact. The right hamstring, iliopsoas, rectus femoris, and adductor tendons appear intact without tear or significant tendinosis. Normal muscle bulk and signal intensity without edema, atrophy, or fatty infiltration. Other findings Miscellaneous: Partially visualized degenerative disc disease within the lower lumbar spine. LEFT HIP: Bones: No acute fracture. No dislocation. Geographic area of peripheral low T2 signal intensity with surrounding marrow edema within the superior aspect of the left femoral head compatible with avascular necrosis. Subtle flattening of the superior aspect of the left femoral head (series 11, image 15). Remainder of the proximal left femur is intact. Left hemipelvis within normal limits. No additional areas of marrow signal abnormality. No  suspicious marrow replacing lesion. Articular cartilage and labrum Articular cartilage: High-grade chondral loss of the femoral head and acetabulum with associated subchondral marrow signal changes, most pronounced within the posterior acetabulum. Labrum:  Advanced superior labral degeneration and tearing. Joint or bursal effusion Joint effusion:  Small left hip joint effusion. Bursae: No abnormal bursal fluid collection. Muscles and tendons Muscles and tendons: Tiny  insertional tears of the left gluteus minimus tendon. There is prominent left gluteus minimus intramuscular edema. The left hamstring, iliopsoas, rectus femoris, and adductor tendons appear intact without tear or significant tendinosis. Other findings Miscellaneous:   None. IMPRESSION: 1. Findings of avascular necrosis involving the bilateral femoral heads, left worse than right. There is subtle flattening of the bilateral femoral heads suggesting a component of early subcortical collapse. 2. Moderate bilateral hip osteoarthritis, left worse than right. 3. Small left hip joint effusion. 4. Low-grade insertional tears of the bilateral gluteus minimus tendons. Prominent left gluteus minimus intramuscular edema, likely reactive. Electronically Signed   By: Davina Poke D.O.   On: 05/02/2020 15:53   MR HIP LEFT WO CONTRAST  Result Date: 05/02/2020 CLINICAL DATA:  Bilateral hip pain for 10 days EXAM: MR OF THE RIGHT HIP WITHOUT CONTRAST MRI OF THE LEFT HIP WITHOUT CONTRAST TECHNIQUE: Multiplanar, multisequence MR imaging was performed. No intravenous contrast was administered. COMPARISON:  X-ray 05/02/2020 FINDINGS: RIGHT HIP: Bones: No acute fracture. No dislocation. There is a small focus of avascular necrosis involving the superior aspect of the right femoral head (series 8, image 25) with minimal adjacent T2 marrow hyperintensity. There is subtle flattening of the femoral head contour. Remainder of the proximal right femur is intact. Right hemipelvis is within normal limits. No additional areas of marrow signal abnormality. No suspicious marrow replacing lesion. Articular cartilage and labrum Articular cartilage: Areas of full-thickness chondral fissuring along the superior femoral head overlying the site of AVN (series 11, images 18-19). There is also high-grade chondral loss within the anterosuperior acetabulum with associated subchondral cystic changes. Labrum:  Superior labral degeneration. Joint or  bursal effusion Joint effusion:  No joint effusion. Bursae: No abnormal bursal fluid collection. Muscles and tendons Muscles and tendons: Low-grade insertional tears of the right gluteus minimus tendon. Gluteus medius tendon intact. The right hamstring, iliopsoas, rectus femoris, and adductor tendons appear intact without tear or significant tendinosis. Normal muscle bulk and signal intensity without edema, atrophy, or fatty infiltration. Other findings Miscellaneous: Partially visualized degenerative disc disease within the lower lumbar spine. LEFT HIP: Bones: No acute fracture. No dislocation. Geographic area of peripheral low T2 signal intensity with surrounding marrow edema within the superior aspect of the left femoral head compatible with avascular necrosis. Subtle flattening of the superior aspect of the left femoral head (series 11, image 15). Remainder of the proximal left femur is intact. Left hemipelvis within normal limits. No additional areas of marrow signal abnormality. No suspicious marrow replacing lesion. Articular cartilage and labrum Articular cartilage: High-grade chondral loss of the femoral head and acetabulum with associated subchondral marrow signal changes, most pronounced within the posterior acetabulum. Labrum:  Advanced superior labral degeneration and tearing. Joint or bursal effusion Joint effusion:  Small left hip joint effusion. Bursae: No abnormal bursal fluid collection. Muscles and tendons Muscles and tendons: Tiny insertional tears of the left gluteus minimus tendon. There is prominent left gluteus minimus intramuscular edema. The left hamstring, iliopsoas, rectus femoris, and adductor tendons appear intact without tear or significant tendinosis. Other findings Miscellaneous:   None. IMPRESSION: 1. Findings of  avascular necrosis involving the bilateral femoral heads, left worse than right. There is subtle flattening of the bilateral femoral heads suggesting a component of early  subcortical collapse. 2. Moderate bilateral hip osteoarthritis, left worse than right. 3. Small left hip joint effusion. 4. Low-grade insertional tears of the bilateral gluteus minimus tendons. Prominent left gluteus minimus intramuscular edema, likely reactive. Electronically Signed   By: Davina Poke D.O.   On: 05/02/2020 15:53   DG Hip Unilat With Pelvis 2-3 Views Left  Result Date: 05/02/2020 CLINICAL DATA:  Pain EXAM: DG HIP (WITH OR WITHOUT PELVIS) 2-3V LEFT COMPARISON:  None. FINDINGS: Frontal pelvis as well as frontal and lateral left hip images were obtained. There is no fracture or dislocation. There is mild symmetric narrowing of each hip joint. There are foci of sclerosis in each femoral head, suggesting a degree of underlying avascular necrosis. There is degenerative change in the lower lumbar spine. IMPRESSION: No fracture or dislocation. Mild symmetric narrowing of each hip joint. Suspect foci of avascular necrosis in each femoral head. From an imaging standpoint, MR is the optimum study of choice to assess for avascular necrosis. Degenerative change noted in the lower lumbar spine. Electronically Signed   By: Lowella Grip III M.D.   On: 05/02/2020 13:39    ____________________________________________   PROCEDURES  Procedure(s) performed (including Critical Care):  Procedures   ____________________________________________   INITIAL IMPRESSION / ASSESSMENT AND PLAN / ED COURSE  As part of my medical decision making, I reviewed the following data within the electronic MEDICAL RECORD NUMBER Notes from prior ED visits and  Controlled Substance Database    Patient presents with left hip pain which is increased in the past 10 days.  Patient states pain increases with weightbearing.  Differentials consist of acute versus acute fracture, DJD, or AVN.  Discussed x-ray and MRI findings.  Discussed patient with Dr. Gabriel Earing on-call orthopedics.  Patient given discharge care  instruction follow-up with paper call for an appointment in 3 days.  Return to ED if condition worsens.          ____________________________________________   FINAL CLINICAL IMPRESSION(S) / ED DIAGNOSES  Final diagnoses:  AVN (avascular necrosis of bone) ([place])     ED Discharge Orders         Ordered    lidocaine (LIDODERM) 5 %  Every 12 hours        05/02/20 1612    traMADol (ULTRAM) 50 MG tablet  Every 12 hours PRN        05/02/20 1612          *Please note:  Sarah Cunningham was evaluated in Emergency Department on 05/02/2020 for the symptoms described in the history of present illness. She was evaluated in the context of the global COVID-19 pandemic, which necessitated consideration that the patient might be at risk for infection with the SARS-CoV-2 virus that causes COVID-19. Institutional protocols and algorithms that pertain to the evaluation of patients at risk for COVID-19 are in a state of rapid change based on information released by regulatory bodies including the CDC and federal and state organizations. These policies and algorithms were followed during the patient's care in the ED.  Some ED evaluations and interventions may be delayed as a result of limited staffing during and the pandemic.*   Note:  This document was prepared using Dragon voice recognition software and may include unintentional dictation errors.    Sable Feil, PA-C 05/02/20 1619    Blake Divine,  MD 05/03/20 1541

## 2020-05-02 NOTE — ED Notes (Signed)
Pt given walker and instructed on use. Pt with successful return demonstration

## 2020-05-02 NOTE — Discharge Instructions (Addendum)
Follow discharge care instructions and walk with assistance pending evaluation by orthopedics.  Call after 8:30 a.m. Monday morning.  Tell them you are a follow-up from the emergency room visit per Dr. Rudene Christians.  Change Lidoderm patches every 12 hours.  You were also prescribed Tramadol to take as needed for acute pain.

## 2020-05-06 DIAGNOSIS — M87052 Idiopathic aseptic necrosis of left femur: Secondary | ICD-10-CM | POA: Diagnosis not present

## 2020-05-06 DIAGNOSIS — M87051 Idiopathic aseptic necrosis of right femur: Secondary | ICD-10-CM | POA: Diagnosis not present

## 2020-05-09 DIAGNOSIS — M87051 Idiopathic aseptic necrosis of right femur: Secondary | ICD-10-CM | POA: Diagnosis not present

## 2020-05-09 DIAGNOSIS — M87052 Idiopathic aseptic necrosis of left femur: Secondary | ICD-10-CM | POA: Diagnosis not present

## 2020-07-14 ENCOUNTER — Other Ambulatory Visit: Payer: Self-pay | Admitting: Family Medicine

## 2020-08-11 DIAGNOSIS — I6381 Other cerebral infarction due to occlusion or stenosis of small artery: Secondary | ICD-10-CM | POA: Diagnosis not present

## 2020-08-11 DIAGNOSIS — G500 Trigeminal neuralgia: Secondary | ICD-10-CM | POA: Diagnosis not present

## 2020-09-12 DIAGNOSIS — N390 Urinary tract infection, site not specified: Secondary | ICD-10-CM | POA: Diagnosis not present

## 2020-11-03 DIAGNOSIS — R739 Hyperglycemia, unspecified: Secondary | ICD-10-CM | POA: Diagnosis not present

## 2020-11-03 DIAGNOSIS — E041 Nontoxic single thyroid nodule: Secondary | ICD-10-CM | POA: Diagnosis not present

## 2020-11-03 DIAGNOSIS — Z0000 Encounter for general adult medical examination without abnormal findings: Secondary | ICD-10-CM | POA: Diagnosis not present

## 2020-11-03 DIAGNOSIS — Z1231 Encounter for screening mammogram for malignant neoplasm of breast: Secondary | ICD-10-CM | POA: Diagnosis not present

## 2020-11-03 DIAGNOSIS — M87052 Idiopathic aseptic necrosis of left femur: Secondary | ICD-10-CM | POA: Diagnosis not present

## 2020-11-03 DIAGNOSIS — I10 Essential (primary) hypertension: Secondary | ICD-10-CM | POA: Diagnosis not present

## 2020-11-03 DIAGNOSIS — M87051 Idiopathic aseptic necrosis of right femur: Secondary | ICD-10-CM | POA: Diagnosis not present

## 2020-11-03 DIAGNOSIS — Z780 Asymptomatic menopausal state: Secondary | ICD-10-CM | POA: Diagnosis not present

## 2020-11-03 DIAGNOSIS — E7800 Pure hypercholesterolemia, unspecified: Secondary | ICD-10-CM | POA: Diagnosis not present

## 2020-11-03 DIAGNOSIS — Z1211 Encounter for screening for malignant neoplasm of colon: Secondary | ICD-10-CM | POA: Diagnosis not present

## 2020-11-04 ENCOUNTER — Other Ambulatory Visit: Payer: Self-pay | Admitting: Internal Medicine

## 2020-11-04 DIAGNOSIS — Z1231 Encounter for screening mammogram for malignant neoplasm of breast: Secondary | ICD-10-CM

## 2020-11-12 DIAGNOSIS — M810 Age-related osteoporosis without current pathological fracture: Secondary | ICD-10-CM | POA: Diagnosis not present

## 2020-11-24 DIAGNOSIS — Z1211 Encounter for screening for malignant neoplasm of colon: Secondary | ICD-10-CM | POA: Diagnosis not present

## 2020-12-29 ENCOUNTER — Ambulatory Visit
Admission: RE | Admit: 2020-12-29 | Discharge: 2020-12-29 | Disposition: A | Discharge status: Home / Self Care | Payer: PPO | Source: Ambulatory Visit | Attending: Internal Medicine | Admitting: Internal Medicine

## 2020-12-29 ENCOUNTER — Other Ambulatory Visit: Payer: Self-pay

## 2020-12-29 DIAGNOSIS — Z1231 Encounter for screening mammogram for malignant neoplasm of breast: Secondary | ICD-10-CM

## 2021-02-10 DIAGNOSIS — R195 Other fecal abnormalities: Secondary | ICD-10-CM | POA: Diagnosis not present

## 2021-04-08 DIAGNOSIS — R739 Hyperglycemia, unspecified: Secondary | ICD-10-CM | POA: Diagnosis not present

## 2021-04-08 DIAGNOSIS — E7800 Pure hypercholesterolemia, unspecified: Secondary | ICD-10-CM | POA: Diagnosis not present

## 2021-04-08 DIAGNOSIS — E041 Nontoxic single thyroid nodule: Secondary | ICD-10-CM | POA: Diagnosis not present

## 2021-04-08 DIAGNOSIS — Z780 Asymptomatic menopausal state: Secondary | ICD-10-CM | POA: Diagnosis not present

## 2021-05-06 DIAGNOSIS — G500 Trigeminal neuralgia: Secondary | ICD-10-CM | POA: Diagnosis not present

## 2021-05-06 DIAGNOSIS — M87051 Idiopathic aseptic necrosis of right femur: Secondary | ICD-10-CM | POA: Diagnosis not present

## 2021-05-06 DIAGNOSIS — E7800 Pure hypercholesterolemia, unspecified: Secondary | ICD-10-CM | POA: Diagnosis not present

## 2021-05-06 DIAGNOSIS — M87052 Idiopathic aseptic necrosis of left femur: Secondary | ICD-10-CM | POA: Diagnosis not present

## 2021-05-06 DIAGNOSIS — I10 Essential (primary) hypertension: Secondary | ICD-10-CM | POA: Diagnosis not present

## 2021-05-06 DIAGNOSIS — M810 Age-related osteoporosis without current pathological fracture: Secondary | ICD-10-CM | POA: Diagnosis not present

## 2021-09-11 DIAGNOSIS — I6381 Other cerebral infarction due to occlusion or stenosis of small artery: Secondary | ICD-10-CM | POA: Diagnosis not present

## 2021-09-11 DIAGNOSIS — Z7189 Other specified counseling: Secondary | ICD-10-CM | POA: Diagnosis not present

## 2021-09-11 DIAGNOSIS — M8700 Idiopathic aseptic necrosis of unspecified bone: Secondary | ICD-10-CM | POA: Diagnosis not present

## 2021-09-11 DIAGNOSIS — Z79899 Other long term (current) drug therapy: Secondary | ICD-10-CM | POA: Diagnosis not present

## 2021-09-11 DIAGNOSIS — G500 Trigeminal neuralgia: Secondary | ICD-10-CM | POA: Diagnosis not present

## 2021-09-14 ENCOUNTER — Encounter: Payer: Self-pay | Admitting: *Deleted

## 2021-09-15 ENCOUNTER — Ambulatory Visit: Payer: PPO | Admitting: Anesthesiology

## 2021-09-15 ENCOUNTER — Encounter: Payer: Self-pay | Admitting: *Deleted

## 2021-09-15 ENCOUNTER — Encounter
Admission: RE | Disposition: A | Discharge status: Home / Self Care | Payer: Self-pay | Source: Home / Self Care | Attending: Gastroenterology

## 2021-09-15 ENCOUNTER — Ambulatory Visit
Admission: RE | Admit: 2021-09-15 | Discharge: 2021-09-15 | Disposition: A | Discharge status: Home / Self Care | Payer: PPO | Attending: Gastroenterology | Admitting: Gastroenterology

## 2021-09-15 DIAGNOSIS — D120 Benign neoplasm of cecum: Secondary | ICD-10-CM | POA: Diagnosis not present

## 2021-09-15 DIAGNOSIS — Z136 Encounter for screening for cardiovascular disorders: Secondary | ICD-10-CM | POA: Diagnosis not present

## 2021-09-15 DIAGNOSIS — R195 Other fecal abnormalities: Secondary | ICD-10-CM | POA: Insufficient documentation

## 2021-09-15 DIAGNOSIS — M1990 Unspecified osteoarthritis, unspecified site: Secondary | ICD-10-CM | POA: Insufficient documentation

## 2021-09-15 DIAGNOSIS — K621 Rectal polyp: Secondary | ICD-10-CM | POA: Diagnosis not present

## 2021-09-15 DIAGNOSIS — K635 Polyp of colon: Secondary | ICD-10-CM | POA: Insufficient documentation

## 2021-09-15 DIAGNOSIS — Z1211 Encounter for screening for malignant neoplasm of colon: Secondary | ICD-10-CM | POA: Insufficient documentation

## 2021-09-15 DIAGNOSIS — D128 Benign neoplasm of rectum: Secondary | ICD-10-CM | POA: Diagnosis not present

## 2021-09-15 DIAGNOSIS — G500 Trigeminal neuralgia: Secondary | ICD-10-CM | POA: Diagnosis not present

## 2021-09-15 DIAGNOSIS — D127 Benign neoplasm of rectosigmoid junction: Secondary | ICD-10-CM | POA: Diagnosis not present

## 2021-09-15 DIAGNOSIS — K5730 Diverticulosis of large intestine without perforation or abscess without bleeding: Secondary | ICD-10-CM | POA: Diagnosis not present

## 2021-09-15 DIAGNOSIS — I10 Essential (primary) hypertension: Secondary | ICD-10-CM | POA: Diagnosis not present

## 2021-09-15 HISTORY — DX: Hyperlipidemia, unspecified: E78.5

## 2021-09-15 SURGERY — COLONOSCOPY WITH PROPOFOL
Anesthesia: General

## 2021-09-15 MED ORDER — PROPOFOL 10 MG/ML IV BOLUS
INTRAVENOUS | Status: AC
Start: 2021-09-15 — End: ?
  Filled 2021-09-15: qty 40

## 2021-09-15 MED ORDER — PROPOFOL 10 MG/ML IV BOLUS
INTRAVENOUS | Status: DC | PRN
  Administered 2021-09-15: 60 mg via INTRAVENOUS

## 2021-09-15 MED ORDER — PROPOFOL 500 MG/50ML IV EMUL
INTRAVENOUS | Status: DC | PRN
  Administered 2021-09-15: 150 ug/kg/min via INTRAVENOUS

## 2021-09-15 MED ORDER — SODIUM CHLORIDE 0.9 % IV SOLN: INTRAVENOUS | Status: DC

## 2021-09-15 NOTE — Interval H&P Note (Signed)
History and Physical Interval Note: ? ?09/15/2021 ?10:36 AM ? ?Sarah Cunningham  has presented today for surgery, with the diagnosis of + Cologuard.  The various methods of treatment have been discussed with the patient and family. After consideration of risks, benefits and other options for treatment, the patient has consented to  Procedure(s): ?COLONOSCOPY WITH PROPOFOL (N/A) as a surgical intervention.  The patient's history has been reviewed, patient examined, no change in status, stable for surgery.  I have reviewed the patient's chart and labs.  Questions were answered to the patient's satisfaction.   ? ? ?Hilton Cork Kathlyn Leachman ? ?Ok to proceed with colonoscopy ?

## 2021-09-15 NOTE — Transfer of Care (Signed)
Immediate Anesthesia Transfer of Care Note ? ?Patient: Sarah Cunningham ? ?Procedure(s) Performed: COLONOSCOPY WITH PROPOFOL ? ?Patient Location: Endoscopy Unit ? ?Anesthesia Type:General ? ?Level of Consciousness: awake ? ?Airway & Oxygen Therapy: Patient Spontanous Breathing ? ?Post-op Assessment: Report given to RN and Post -op Vital signs reviewed and stable ? ?Post vital signs: Reviewed and stable ? ?Last Vitals:  ?Vitals Value Taken Time  ?BP 93/60 09/15/21 1109  ?Temp    ?Pulse 88 09/15/21 1110  ?Resp 21 09/15/21 1110  ?SpO2 100 % 09/15/21 1110  ?Vitals shown include unvalidated device data. ? ?Last Pain:  ?Vitals:  ? 09/15/21 1100  ?TempSrc: Temporal  ?PainSc:   ?   ? ?  ? ?Complications: No notable events documented. ?

## 2021-09-15 NOTE — H&P (Signed)
Outpatient short stay form Pre-procedure ?09/15/2021  ?Sarah Rubenstein, MD ? ?Primary Physician: Gladstone Lighter, MD ? ?Reason for visit:  Positive cologuard ? ?History of present illness:   ? ?76 y/o lady with history of hypertension and arthritis here for colonoscopy for positive cologuard. No prior colonoscopy. History of appendectomy. No blood thinners. No family history of GI malignancies. ? ? ? ?Current Facility-Administered Medications:  ?  0.9 %  sodium chloride infusion, , Intravenous, Continuous, Harvey Matlack, Hilton Cork, MD, Last Rate: 20 mL/hr at 09/15/21 1029, New Bag at 09/15/21 1029 ? ?Medications Prior to Admission  ?Medication Sig Dispense Refill Last Dose  ? alendronate (FOSAMAX) 70 MG tablet Take by mouth.   09/14/2021  ? amLODipine (NORVASC) 5 MG tablet Take by mouth.   09/14/2021  ? aspirin EC 81 MG tablet Take 81 mg by mouth daily. Swallow whole.   09/14/2021  ? atorvastatin (LIPITOR) 40 MG tablet TAKE ONE TABLET BY MOUTH DAILY   09/14/2021  ? baclofen (LIORESAL) 10 MG tablet Take 10 mg by mouth 3 (three) times daily.   09/14/2021  ? Calcium Carbonate-Vitamin D 600-400 MG-UNIT tablet Take by mouth.   Past Week  ? ibuprofen (ADVIL) 400 MG tablet Take 400 mg by mouth 2 (two) times daily as needed.     ? lamoTRIgine (LAMICTAL) 100 MG tablet    09/14/2021  ? Multiple Vitamin (MULTI-VITAMINS) TABS Take by mouth.   Past Week  ? Multiple Vitamins-Minerals (PRESERVISION AREDS 2+MULTI VIT PO) Take by mouth 2 (two) times daily.   09/14/2021  ? perindopril (ACEON) 4 MG tablet Take by mouth.   09/14/2021  ? albuterol (PROVENTIL HFA;VENTOLIN HFA) 108 (90 Base) MCG/ACT inhaler Inhale into the lungs.     ? traMADol (ULTRAM) 50 MG tablet Take 1 tablet (50 mg total) by mouth every 12 (twelve) hours as needed. 12 tablet 0   ? Turmeric Curcumin 500 MG CAPS Take by mouth.     ? ? ? ?Allergies  ?Allergen Reactions  ? Carbamazepine Rash  ? ? ? ?Past Medical History:  ?Diagnosis Date  ? Arthritis 2013  ? rt hip  ? HLD  (hyperlipidemia)   ? Hypertension   ? ? ?Review of systems:  Otherwise negative.  ? ? ?Physical Exam ? ?Gen: Alert, oriented. Appears stated age.  ?HEENT: PERRLA. ?Lungs: No respiratory distress ?CV: RRR ?Abd: soft, benign, no masses ?Ext: No edema ? ? ? ?Planned procedures: Proceed with colonoscopy. The patient understands the nature of the planned procedure, indications, risks, alternatives and potential complications including but not limited to bleeding, infection, perforation, damage to internal organs and possible oversedation/side effects from anesthesia. The patient agrees and gives consent to proceed.  ?Please refer to procedure notes for findings, recommendations and patient disposition/instructions.  ? ? ? ?Sarah Rubenstein, MD ?[facility] ? ? ? ?  ? ?

## 2021-09-15 NOTE — Op Note (Signed)
[facility] ?Gastroenterology ?Patient Name: Sarah Cunningham ?Procedure Date: 09/15/2021 10:30 AM ?MRN: [id number] ?Account #: [id number] ?Date of Birth: 02-May-1946 ?Admit Type: Outpatient ?Age: 76 ?Room: [facility] ENDO ROOM 1 ?Gender: Female ?Note Status: Finalized ?Instrument Name: Peds Colonoscope [id number] ?Procedure:             Colonoscopy ?Indications:           Positive Cologuard test ?Providers:             Andrey Farmer MD, MD ?Referring MD:          Gladstone Lighter, MD (Referring MD) ?Medicines:             Monitored Anesthesia Care ?Complications:         No immediate complications. Estimated blood loss:  ?                       Minimal. ?Procedure:             Pre-Anesthesia Assessment: ?                       - Prior to the procedure, a History and Physical was  ?                       performed, and patient medications and allergies were  ?                       reviewed. The patient is competent. The risks and  ?                       benefits of the procedure and the sedation options and  ?                       risks were discussed with the patient. All questions  ?                       were answered and informed consent was obtained.  ?                       Patient identification and proposed procedure were  ?                       verified by the physician, the nurse, the  ?                       anesthesiologist, the anesthetist and the technician  ?                       in the endoscopy suite. Mental Status Examination:  ?                       alert and oriented. Airway Examination: normal  ?                       oropharyngeal airway and neck mobility. Respiratory  ?                       Examination: clear to auscultation. CV Examination:  ?  normal. Prophylactic Antibiotics: The patient does not  ?                       require prophylactic antibiotics. Prior  ?                       Anticoagulants: The patient has taken no previous  ?                        anticoagulant or antiplatelet agents except for  ?                       aspirin. ASA Grade Assessment: II - A patient with  ?                       mild systemic disease. After reviewing the risks and  ?                       benefits, the patient was deemed in satisfactory  ?                       condition to undergo the procedure. The anesthesia  ?                       plan was to use monitored anesthesia care (MAC).  ?                       Immediately prior to administration of medications,  ?                       the patient was re-assessed for adequacy to receive  ?                       sedatives. The heart rate, respiratory rate, oxygen  ?                       saturations, blood pressure, adequacy of pulmonary  ?                       ventilation, and response to care were monitored  ?                       throughout the procedure. The physical status of the  ?                       patient was re-assessed after the procedure. ?                       After obtaining informed consent, the colonoscope was  ?                       passed under direct vision. Throughout the procedure,  ?                       the patient's blood pressure, pulse, and oxygen  ?                       saturations were monitored continuously. The  ?  Colonoscope was introduced through the anus and  ?                       advanced to the the cecum, identified by appendiceal  ?                       orifice and ileocecal valve. The colonoscopy was  ?                       performed without difficulty. The patient tolerated  ?                       the procedure well. The quality of the bowel  ?                       preparation was good. ?Findings: ?     The perianal and digital rectal examinations were normal. ?     Two sessile polyps were found in the cecum. The polyps were 1 to 3 mm in  ?     size. These polyps were removed with a cold snare. Resection and  ?     retrieval were complete. Estimated  blood loss was minimal. ?     A 3 mm polyp was found in the recto-sigmoid colon. The polyp was  ?     semi-pedunculated. The polyp was removed with a cold snare. Resection  ?     and retrieval were complete. Estimated blood loss was minimal. ?     Several hyperplastic polyps were found in the rectum. The polyps were  ?     small in size. Three polyps were removed with a jumbo cold forceps.  ?     Resection and retrieval were complete. Estimated blood loss was minimal. ?     A few small-mouthed diverticula were found in the sigmoid colon. ?     The exam was otherwise without abnormality on direct and retroflexion  ?     views. ?Impression:            - Two 1 to 3 mm polyps in the cecum, removed with a  ?                       cold snare. Resected and retrieved. ?                       - One 3 mm polyp at the recto-sigmoid colon, removed  ?                       with a cold snare. Resected and retrieved. ?                       - Three small polyps in the rectum, removed with a  ?                       jumbo cold forceps. Resected and retrieved. ?                       - Diverticulosis in the sigmoid colon. ?                       - The examination was otherwise  normal on direct and  ?                       retroflexion views. ?Recommendation:        - Discharge patient to home. ?                       - Resume previous diet. ?                       - Continue present medications. ?                       - Await pathology results. ?                       - Repeat colonoscopy is not recommended due to current  ?                       age (59 years or older) for surveillance. ?                       - Return to referring physician as previously  ?                       scheduled. ?Procedure Code(s):     --- Professional --- ?                       [phone], Colonoscopy, flexible; with removal of  ?                       tumor(s), polyp(s), or other lesion(s) by snare  ?                       technique ?                        45380, 59, Colonoscopy, flexible; with biopsy, single  ?                       or multiple ?Diagnosis Code(s):     --- Professional --- ?                       K63.5, Polyp of colon ?                       K62.1, Rectal polyp ?                       R19.5, Other fecal abnormalities ?                       K57.30, Diverticulosis of large intestine without  ?                       perforation or abscess without bleeding ?CPT copyright 2019 American Medical Association. All rights reserved. ?The codes documented in this report are preliminary and upon coder review may  ?be revised to meet current compliance requirements. ?Andrey Farmer MD, MD ?09/15/2021 11:08:39 AM ?Number of Addenda: 0 ?Note Initiated On: 09/15/2021 10:30 AM ?Scope Withdrawal Time: 0 hours 15 minutes 15 seconds  ?Total Procedure Duration:  0 hours 21 minutes 22 seconds  ?Estimated Blood Loss:  Estimated blood loss was minimal. ?     [facility] ?

## 2021-09-15 NOTE — Anesthesia Postprocedure Evaluation (Signed)
Anesthesia Post Note ? ?Patient: Sarah Cunningham ? ?Procedure(s) Performed: COLONOSCOPY WITH PROPOFOL ? ?Patient location during evaluation: PACU ?Anesthesia Type: General ?Level of consciousness: awake and alert, oriented and patient cooperative ?Pain management: pain level controlled ?Vital Signs Assessment: post-procedure vital signs reviewed and stable ?Respiratory status: spontaneous breathing, nonlabored ventilation and respiratory function stable ?Cardiovascular status: blood pressure returned to baseline and stable ?Postop Assessment: adequate PO intake ?Anesthetic complications: no ? ? ?No notable events documented. ? ? ?Last Vitals:  ?Vitals:  ? 09/15/21 1120 09/15/21 1130  ?BP: 109/73 109/74  ?Pulse: 86 88  ?Resp: 17 20  ?Temp:    ?SpO2: 98% 96%  ?  ?Last Pain:  ?Vitals:  ? 09/15/21 1100  ?TempSrc: Temporal  ?PainSc:   ? ? ?  ?  ?  ?  ?  ?  ? ?Darrin Nipper ? ? ? ? ?

## 2021-09-15 NOTE — Anesthesia Preprocedure Evaluation (Addendum)
Anesthesia Evaluation  ?Patient identified by MRN, date of birth, ID band ?Patient awake ? ? ? ?Reviewed: ?Allergy & Precautions, NPO status , Patient's Chart, lab work & pertinent test results ? ?History of Anesthesia Complications ?Negative for: history of anesthetic complications ? ?Airway ?Mallampati: III ? ? ?Neck ROM: Full ? ? ? Dental ?no notable dental hx. ? ?  ?Pulmonary ?Current Smoker (1/4 ppd)Patient did not abstain from smoking.,  ?  ?Pulmonary exam normal ?breath sounds clear to auscultation ? ? ? ? ? ? Cardiovascular ?hypertension, Normal cardiovascular exam ?Rhythm:Regular Rate:Normal ? ?Echo 10/12/18:  ?NORMAL LEFT VENTRICULAR SYSTOLIC FUNCTION  ?NORMAL RIGHT VENTRICULAR SYSTOLIC FUNCTION  ?MILD VALVULAR REGURGITATION   ?NO VALVULAR STENOSIS ?  ?Neuro/Psych ? Neuromuscular disease (trigeminal neuralgia)   ? GI/Hepatic ?negative GI ROS,   ?Endo/Other  ?negative endocrine ROS ? Renal/GU ?negative Renal ROS  ? ?  ?Musculoskeletal ? ?(+) Arthritis ,  ? Abdominal ?  ?Peds ? Hematology ?negative hematology ROS ?(+)   ?Anesthesia Other Findings ? ? Reproductive/Obstetrics ? ?  ? ? ? ? ? ? ? ? ? ? ? ? ? ?  ?  ? ? ? ? ? ? ? ?Anesthesia Physical ?Anesthesia Plan ? ?ASA: 2 ? ?Anesthesia Plan: General  ? ?Post-op Pain Management:   ? ?Induction: Intravenous ? ?PONV Risk Score and Plan: 2 and Propofol infusion, TIVA and Treatment may vary due to age or medical condition ? ?Airway Management Planned: Natural Airway ? ?Additional Equipment:  ? ?Intra-op Plan:  ? ?Post-operative Plan:  ? ?Informed Consent: I have reviewed the patients History and Physical, chart, labs and discussed the procedure including the risks, benefits and alternatives for the proposed anesthesia with the patient or authorized representative who has indicated his/her understanding and acceptance.  ? ?Patient has DNR.  ?Discussed DNR with patient and Continue DNR. ?  ? ? ?Plan Discussed with: CRNA ? ?Anesthesia  Plan Comments: (LMA/GETA backup discussed.  Patient consented for risks of anesthesia including but not limited to:  ?- adverse reactions to medications ?- damage to eyes, teeth, lips or other oral mucosa ?- nerve damage due to positioning  ?- sore throat or hoarseness ?- damage to heart, brain, nerves, lungs, other parts of body or loss of life ? ?Informed patient about role of CRNA in peri- and intra-operative care.  Patient voiced understanding.)  ? ? ? ? ? ?Anesthesia Quick Evaluation ? ?

## 2021-09-16 ENCOUNTER — Encounter: Payer: Self-pay | Admitting: Gastroenterology

## 2021-09-17 LAB — SURGICAL PATHOLOGY

## 2021-09-29 DIAGNOSIS — G500 Trigeminal neuralgia: Secondary | ICD-10-CM | POA: Diagnosis not present

## 2021-09-29 DIAGNOSIS — M8700 Idiopathic aseptic necrosis of unspecified bone: Secondary | ICD-10-CM | POA: Diagnosis not present

## 2021-10-07 DIAGNOSIS — I6381 Other cerebral infarction due to occlusion or stenosis of small artery: Secondary | ICD-10-CM | POA: Diagnosis not present

## 2021-10-07 DIAGNOSIS — Z79899 Other long term (current) drug therapy: Secondary | ICD-10-CM | POA: Diagnosis not present

## 2021-10-07 DIAGNOSIS — G500 Trigeminal neuralgia: Secondary | ICD-10-CM | POA: Diagnosis not present

## 2021-10-07 DIAGNOSIS — M8700 Idiopathic aseptic necrosis of unspecified bone: Secondary | ICD-10-CM | POA: Diagnosis not present

## 2021-10-12 DIAGNOSIS — M879 Osteonecrosis, unspecified: Secondary | ICD-10-CM | POA: Diagnosis not present

## 2021-10-12 DIAGNOSIS — M8700 Idiopathic aseptic necrosis of unspecified bone: Secondary | ICD-10-CM | POA: Diagnosis not present

## 2021-10-21 ENCOUNTER — Other Ambulatory Visit: Payer: Self-pay | Admitting: Orthopedic Surgery

## 2021-11-04 DIAGNOSIS — Z0000 Encounter for general adult medical examination without abnormal findings: Secondary | ICD-10-CM | POA: Diagnosis not present

## 2021-11-04 DIAGNOSIS — M8700 Idiopathic aseptic necrosis of unspecified bone: Secondary | ICD-10-CM | POA: Diagnosis not present

## 2021-11-04 DIAGNOSIS — G500 Trigeminal neuralgia: Secondary | ICD-10-CM | POA: Diagnosis not present

## 2021-11-04 DIAGNOSIS — E7800 Pure hypercholesterolemia, unspecified: Secondary | ICD-10-CM | POA: Diagnosis not present

## 2021-11-04 DIAGNOSIS — M810 Age-related osteoporosis without current pathological fracture: Secondary | ICD-10-CM | POA: Diagnosis not present

## 2021-11-04 DIAGNOSIS — I10 Essential (primary) hypertension: Secondary | ICD-10-CM | POA: Diagnosis not present

## 2021-11-05 ENCOUNTER — Encounter
Admission: RE | Admit: 2021-11-05 | Discharge: 2021-11-05 | Disposition: A | Discharge status: Home / Self Care | Payer: PPO | Source: Ambulatory Visit | Attending: Orthopedic Surgery | Admitting: Orthopedic Surgery

## 2021-11-05 VITALS — BP 155/82 | HR 73 | Resp 16 | Ht 66.0 in | Wt 121.7 lb

## 2021-11-05 DIAGNOSIS — R8290 Unspecified abnormal findings in urine: Secondary | ICD-10-CM | POA: Diagnosis not present

## 2021-11-05 DIAGNOSIS — Z01812 Encounter for preprocedural laboratory examination: Secondary | ICD-10-CM | POA: Insufficient documentation

## 2021-11-05 DIAGNOSIS — Z01818 Encounter for other preprocedural examination: Secondary | ICD-10-CM

## 2021-11-05 HISTORY — DX: Trigeminal neuralgia: G50.0

## 2021-11-05 HISTORY — DX: Nicotine dependence, unspecified, uncomplicated: F17.200

## 2021-11-05 LAB — CBC WITH DIFFERENTIAL/PLATELET
Abs Immature Granulocytes: 0.03 10*3/uL (ref 0.00–0.07)
Basophils Absolute: 0.1 10*3/uL (ref 0.0–0.1)
Basophils Relative: 1 %
Eosinophils Absolute: 0.2 10*3/uL (ref 0.0–0.5)
Eosinophils Relative: 2 %
HCT: 44.4 % (ref 36.0–46.0)
Hemoglobin: 14.5 g/dL (ref 12.0–15.0)
Immature Granulocytes: 0 %
Lymphocytes Relative: 17 %
Lymphs Abs: 1.8 10*3/uL (ref 0.7–4.0)
MCH: 31.5 pg (ref 26.0–34.0)
MCHC: 32.7 g/dL (ref 30.0–36.0)
MCV: 96.3 fL (ref 80.0–100.0)
Monocytes Absolute: 0.8 10*3/uL (ref 0.1–1.0)
Monocytes Relative: 8 %
Neutro Abs: 7.5 10*3/uL (ref 1.7–7.7)
Neutrophils Relative %: 72 %
Platelets: 326 10*3/uL (ref 150–400)
RBC: 4.61 MIL/uL (ref 3.87–5.11)
RDW: 14 % (ref 11.5–15.5)
WBC: 10.3 10*3/uL (ref 4.0–10.5)
nRBC: 0 % (ref 0.0–0.2)

## 2021-11-05 LAB — URINALYSIS, ROUTINE W REFLEX MICROSCOPIC
Bilirubin Urine: NEGATIVE
Glucose, UA: NEGATIVE mg/dL
Hgb urine dipstick: NEGATIVE
Ketones, ur: NEGATIVE mg/dL
Nitrite: NEGATIVE
Protein, ur: NEGATIVE mg/dL
Specific Gravity, Urine: 1.012 (ref 1.005–1.030)
Squamous Epithelial / HPF: NONE SEEN (ref 0–5)
pH: 6 (ref 5.0–8.0)

## 2021-11-05 LAB — COMPREHENSIVE METABOLIC PANEL
ALT: 36 U/L (ref 0–44)
AST: 36 U/L (ref 15–41)
Albumin: 4.4 g/dL (ref 3.5–5.0)
Alkaline Phosphatase: 158 U/L — ABNORMAL HIGH (ref 38–126)
Anion gap: 11 (ref 5–15)
BUN: 26 mg/dL — ABNORMAL HIGH (ref 8–23)
CO2: 27 mmol/L (ref 22–32)
Calcium: 10.5 mg/dL — ABNORMAL HIGH (ref 8.9–10.3)
Chloride: 101 mmol/L (ref 98–111)
Creatinine, Ser: 0.67 mg/dL (ref 0.44–1.00)
GFR, Estimated: >60 mL/min (ref >60)
Glucose, Bld: 99 mg/dL (ref 70–99)
Potassium: 3.7 mmol/L (ref 3.5–5.1)
Sodium: 139 mmol/L (ref 135–145)
Total Bilirubin: 0.6 mg/dL (ref 0.3–1.2)
Total Protein: 7.2 g/dL (ref 6.5–8.1)

## 2021-11-05 LAB — SURGICAL PCR SCREEN
MRSA, PCR: NEGATIVE
Staphylococcus aureus: NEGATIVE

## 2021-11-05 NOTE — Patient Instructions (Addendum)
Your procedure is scheduled on:11-17-21 Tuesday ?Report to the Registration Desk on the 1st floor of the [facility].Then proceed to the 2nd floor Surgery Desk  ?To find out your arrival time, please call [phone] between 1PM - 3PM on:11-16-21 Monday ?If your arrival time is 6:00 am, do not arrive prior to that time as the [facility] entrance doors do not open until 6:00 am. ? ?REMEMBER: ?Instructions that are not followed completely may result in serious medical risk, up to and including death; or upon the discretion of your surgeon and anesthesiologist your surgery may need to be rescheduled. ? ?Do not eat food after midnight the night before surgery.  ?No gum chewing, lozengers or hard candies. ? ?You may however, drink CLEAR liquids up to 2 hours before you are scheduled to arrive for your surgery. Do not drink anything within 2 hours of your scheduled arrival time. ? ?Clear liquids include: ?- water  ?- apple juice without pulp ?- gatorade (not RED colors) ?- black coffee or tea (Do NOT add milk or creamers to the coffee or tea) ?Do NOT drink anything that is not on this list. ? ?In addition, your doctor has ordered for you to drink the provided  ?Ensure Pre-Surgery Clear Carbohydrate Drink  ?Drinking this carbohydrate drink up to two hours before surgery helps to reduce insulin resistance and improve patient outcomes. Please complete drinking 2 hours prior to scheduled arrival time. ? ?TAKE THESE MEDICATIONS THE MORNING OF SURGERY WITH A SIP OF WATER: ?-baclofen (LIORESAL)  ?-lamoTRIgine (LAMICTAL)  ? ?Use your albuterol (PROVENTIL HFA;VENTOLIN HFA) Inhaler the day of surgery and bring Inhaler to the hospital ? ?Stop your 81 mg Aspirin 7 days prior to surgery-Last dose on 11-09-21 Monday ? ?One week prior to surgery:Last dose on 11-09-21 ?Stop Anti-inflammatories (NSAIDS) such as Advil, Aleve, Ibuprofen, Motrin, Naproxen, Naprosyn and Aspirin based products such as Excedrin, Goodys Powder, BC Powder.You may  however, continue to take Tylenol if needed for pain up until the day of surgery. ?Stop ANY OVER THE COUNTER supplements until after surgery (Multiple Vitamin , PRESERVISION AREDS) ? ?No Alcohol for 24 hours before or after surgery. ? ?No Smoking including e-cigarettes for 24 hours prior to surgery.  ?No chewable tobacco products for at least 6 hours prior to surgery.  ?No nicotine patches on the day of surgery. ? ?Do not use any "recreational" drugs for at least a week prior to your surgery.  ?Please be advised that the combination of cocaine and anesthesia may have negative outcomes, up to and including death. ?If you test positive for cocaine, your surgery will be cancelled. ? ?On the morning of surgery brush your teeth with toothpaste and water, you may rinse your mouth with mouthwash if you wish. ?Do not swallow any toothpaste or mouthwash. ? ?Use CHG Soap as directed on instruction sheet. ? ?Do not wear jewelry, make-up, hairpins, clips or nail polish. ? ?Do not wear lotions, powders, or perfumes.  ? ?Do not shave body from the neck down 48 hours prior to surgery just in case you cut yourself which could leave a site for infection.  ?Also, freshly shaved skin may become irritated if using the CHG soap. ? ?Contact lenses, hearing aids and dentures may not be worn into surgery. ? ?Do not bring valuables to the hospital. [facility] is not responsible for any missing/lost belongings or valuables.  ? ?Notify your doctor if there is any change in your medical condition (cold, fever, infection). ? ?  Wear comfortable clothing (specific to your surgery type) to the hospital. ? ?After surgery, you can help prevent lung complications by doing breathing exercises.  ?Take deep breaths and cough every 1-2 hours. Your doctor may order a device called an Incentive Spirometer to help you take deep breaths. ?When coughing or sneezing, hold a pillow firmly against your incision with both hands. This is called ?splinting.? Doing  this helps protect your incision. It also decreases belly discomfort. ? ?If you are being admitted to the hospital overnight, leave your suitcase in the car. ?After surgery it may be brought to your room. ? ?If you are being discharged the day of surgery, you will not be allowed to drive home. ?You will need a responsible adult (18 years or older) to drive you home and stay with you that night.  ? ?If you are taking public transportation, you will need to have a responsible adult (18 years or older) with you. ?Please confirm with your physician that it is acceptable to use public transportation.  ? ?Please call the [place] Dept. at [phone] if you have any questions about these instructions. ? ?Surgery Visitation Policy: ? ?Patients undergoing a surgery or procedure may have two family members or support persons with them as long as the person is not COVID-19 positive or experiencing its symptoms.  ? ?Inpatient Visitation:   ? ?Visiting hours are 7 a.m. to 8 p.m. ?Up to four visitors are allowed at one time in a patient room, including children. The visitors may rotate out with other people during the day. One designated support person (adult) may remain overnight.  ?

## 2021-11-08 LAB — URINE CULTURE: Culture: >=100000 — AB

## 2021-11-09 ENCOUNTER — Telehealth: Payer: Self-pay | Admitting: Urgent Care

## 2021-11-09 DIAGNOSIS — B9620 Unspecified Escherichia coli [E. coli] as the cause of diseases classified elsewhere: Secondary | ICD-10-CM

## 2021-11-09 DIAGNOSIS — Z01812 Encounter for preprocedural laboratory examination: Secondary | ICD-10-CM

## 2021-11-09 MED ORDER — SULFAMETHOXAZOLE-TRIMETHOPRIM 400-80 MG PO TABS: 1.0000 | ORAL_TABLET | Freq: Two times a day (BID) | ORAL | 0 refills | Status: AC

## 2021-11-09 NOTE — Progress Notes (Signed)
?[facility] ?Perioperative Services: Pre-Admission/Anesthesia Testing ? ?Abnormal Lab Notification and Treatment Plan of Care ?  ?Date: 11/09/21 ? ?Name: Sarah Cunningham ?MRN:   [id number] ? ?Re: Abnormal labs noted during PAT appointment  ? ?Notified:  ?Provider Name Provider Role Notification Mode  ?Hessie Knows, MD Orthopedics (Surgeon) Routed and/or faxed via [facility]  ? ?Abnormal Lab Value(s):  ? ?Lab Results  ?Component Value Date  ? COLORURINE YELLOW (A) 11/05/2021  ? APPEARANCEUR HAZY (A) 11/05/2021  ? LABSPEC 1.012 11/05/2021  ? PHURINE 6.0 11/05/2021  ? GLUCOSEU NEGATIVE 11/05/2021  ? [place] NEGATIVE 11/05/2021  ? [place] NEGATIVE 11/05/2021  ? [place] NEGATIVE 11/05/2021  ? PROTEINUR NEGATIVE 11/05/2021  ? NITRITE NEGATIVE 11/05/2021  ? LEUKOCYTESUR LARGE (A) 11/05/2021  ? EPIU NONE SEEN 11/05/2021  ? WBCU 21-50 11/05/2021  ? RBCU 0-5 11/05/2021  ? BACTERIA MANY (A) 11/05/2021  ? CULT >=100,000 COLONIES/mL ESCHERICHIA COLI (A) 11/05/2021  ? ? ?Clinical Information and Notes:  ?Patient is scheduled for an elective LEFT TOTAL HIP ARTHROPLASTY on 11/17/2021. ? ?UA performed in PAT concerning for infection.  ?No leukocytosis noted on CBC; WBC 10,300 K/uL ?Renal function: Estimated Creatinine Clearance: 50.5 mL/min (by C-G formula based on SCr of 0.67 mg/dL). ?Urine C&S added to assess for pathogenically significant growth. ? ?Impression and Plan:  ?LIEL RUDDEN with a UA that was (+) for infection. Reflex culture sent that grew out a significant Escherichia coli colony count. Contacted patient to discuss. Patient reporting that she is experiencing minor symptoms at this point; frequency, urgency, and very mild dysuria.  Patient denies any back pain, abdominal pain, nausea, vomiting, fever, or chills.  Patient is scheduled for elective orthopedic procedure soon with Dr. Rudene Christians.  In efforts to avoid delaying patient's procedure, or have her experience any potentially significant  perioperative complications related to the aforementioned, I would like to proceed with empiric treatment for urinary tract infection. ? ?Allergies reviewed. Culture report also reviewed to ensure culture appropriate coverage is being provided. Will treat with a 3 day course of SMZ-TMP. Patient encouraged to complete the entire course of antibiotics even if she begins to feel better.  ? ?Meds ordered this encounter  ?Medications  ? sulfamethoxazole-trimethoprim (BACTRIM) 400-80 MG tablet  ?  Sig: Take 1 tablet by mouth 2 (two) times daily for 3 days.  ?  Dispense:  6 tablet  ?  Refill:  0  ? ?Patient encouraged to increase her fluid intake as much as possible. Discussed that water is always best to flush the urinary tract. She was advised to avoid caffeine containing fluids until her infections clears, as caffeine can cause her to experience painful bladder  spasms.  ? ?May use Tylenol as needed for pain/fever should she experience these symptoms.  ? ?Patient instructed to call surgeon's office or PAT with any questions or concerns related to the above outlined course of treatment. Additionally, she was instructed to call if she feels like she is getting worse overall while on treatment. Results and treatment plan of care forwarded to primary attending surgeon to make them aware.  ? ?Encounter Diagnoses  ?Name Primary?  ? Pre-operative laboratory examination Yes  ? E. coli UTI (urinary tract infection)   ? ?Honor Loh, MSN, APRN, FNP-C, CEN ?[place]  ?Peri-operative Services Nurse Practitioner ?Phone: [phone] ?Fax: [phone] ?11/09/21 8:51 AM ? ?NOTE: This note has been prepared using [employer]. Despite my best ability to proofread, there is always  the potential that unintentional transcriptional errors may still occur from this process. ? ?

## 2021-11-10 DIAGNOSIS — M879 Osteonecrosis, unspecified: Secondary | ICD-10-CM | POA: Diagnosis not present

## 2021-11-10 DIAGNOSIS — M8700 Idiopathic aseptic necrosis of unspecified bone: Secondary | ICD-10-CM | POA: Diagnosis not present

## 2021-11-10 LAB — TYPE AND SCREEN
ABO/RH(D): A POS
Antibody Screen: NEGATIVE

## 2021-11-16 MED ORDER — CEFAZOLIN SODIUM-DEXTROSE 2-4 GM/100ML-% IV SOLN
2.0000 g | INTRAVENOUS | Status: AC
Start: 2021-11-17 — End: 2021-11-17
  Administered 2021-11-17: 2 g via INTRAVENOUS

## 2021-11-17 ENCOUNTER — Observation Stay
Admission: RE | Admit: 2021-11-17 | Discharge: 2021-11-18 | Disposition: A | Discharge status: Home Health | Payer: PPO | Attending: Orthopedic Surgery | Admitting: Orthopedic Surgery

## 2021-11-17 ENCOUNTER — Other Ambulatory Visit: Payer: Self-pay

## 2021-11-17 ENCOUNTER — Ambulatory Visit: Payer: PPO

## 2021-11-17 ENCOUNTER — Encounter: Payer: Self-pay | Admitting: Orthopedic Surgery

## 2021-11-17 ENCOUNTER — Ambulatory Visit: Payer: PPO | Admitting: Urgent Care

## 2021-11-17 ENCOUNTER — Observation Stay: Payer: PPO

## 2021-11-17 ENCOUNTER — Encounter
Admission: RE | Disposition: A | Discharge status: Home Health | Payer: Self-pay | Source: Home / Self Care | Attending: Orthopedic Surgery

## 2021-11-17 ENCOUNTER — Other Ambulatory Visit: Payer: Self-pay | Admitting: Radiology

## 2021-11-17 DIAGNOSIS — I10 Essential (primary) hypertension: Secondary | ICD-10-CM

## 2021-11-17 DIAGNOSIS — E7800 Pure hypercholesterolemia, unspecified: Secondary | ICD-10-CM

## 2021-11-17 DIAGNOSIS — F17200 Nicotine dependence, unspecified, uncomplicated: Secondary | ICD-10-CM | POA: Diagnosis not present

## 2021-11-17 DIAGNOSIS — Z7982 Long term (current) use of aspirin: Secondary | ICD-10-CM | POA: Diagnosis not present

## 2021-11-17 DIAGNOSIS — Z96642 Presence of left artificial hip joint: Secondary | ICD-10-CM

## 2021-11-17 DIAGNOSIS — M160 Bilateral primary osteoarthritis of hip: Secondary | ICD-10-CM | POA: Diagnosis not present

## 2021-11-17 DIAGNOSIS — M87052 Idiopathic aseptic necrosis of left femur: Secondary | ICD-10-CM | POA: Diagnosis not present

## 2021-11-17 DIAGNOSIS — Z419 Encounter for procedure for purposes other than remedying health state, unspecified: Secondary | ICD-10-CM

## 2021-11-17 DIAGNOSIS — G500 Trigeminal neuralgia: Secondary | ICD-10-CM

## 2021-11-17 DIAGNOSIS — Z79899 Other long term (current) drug therapy: Secondary | ICD-10-CM | POA: Insufficient documentation

## 2021-11-17 DIAGNOSIS — M8589 Other specified disorders of bone density and structure, multiple sites: Secondary | ICD-10-CM

## 2021-11-17 DIAGNOSIS — M1612 Unilateral primary osteoarthritis, left hip: Secondary | ICD-10-CM | POA: Diagnosis not present

## 2021-11-17 DIAGNOSIS — Z7185 Encounter for immunization safety counseling: Secondary | ICD-10-CM

## 2021-11-17 DIAGNOSIS — Z471 Aftercare following joint replacement surgery: Secondary | ICD-10-CM | POA: Diagnosis not present

## 2021-11-17 HISTORY — PX: TOTAL HIP ARTHROPLASTY: SHX124

## 2021-11-17 LAB — CBC
HCT: 39.5 % (ref 36.0–46.0)
Hemoglobin: 12.9 g/dL (ref 12.0–15.0)
MCH: 31.2 pg (ref 26.0–34.0)
MCHC: 32.7 g/dL (ref 30.0–36.0)
MCV: 95.4 fL (ref 80.0–100.0)
Platelets: 263 10*3/uL (ref 150–400)
RBC: 4.14 MIL/uL (ref 3.87–5.11)
RDW: 13.6 % (ref 11.5–15.5)
WBC: 11.5 10*3/uL — ABNORMAL HIGH (ref 4.0–10.5)
nRBC: 0 % (ref 0.0–0.2)

## 2021-11-17 LAB — CREATININE, SERUM
Creatinine, Ser: 0.55 mg/dL (ref 0.44–1.00)
GFR, Estimated: >60 mL/min (ref >60)

## 2021-11-17 SURGERY — ARTHROPLASTY, HIP, TOTAL, ANTERIOR APPROACH
Anesthesia: Spinal | Site: Hip | Laterality: Left

## 2021-11-17 MED ORDER — PHENYLEPHRINE HCL (PRESSORS) 10 MG/ML IV SOLN
INTRAVENOUS | Status: DC | PRN
  Administered 2021-11-17 (×4): 160 ug via INTRAVENOUS
  Administered 2021-11-17: 80 ug via INTRAVENOUS

## 2021-11-17 MED ORDER — MORPHINE SULFATE (PF) 2 MG/ML IV SOLN
0.5000 mg | INTRAVENOUS | Status: DC | PRN
  Administered 2021-11-17: 1 mg via INTRAVENOUS
  Filled 2021-11-17: qty 1

## 2021-11-17 MED ORDER — OCUVITE-LUTEIN PO CAPS
1.0000 | ORAL_CAPSULE | Freq: Two times a day (BID) | ORAL | Status: DC
Start: 2021-11-17 — End: 2021-11-18
  Filled 2021-11-17 (×2): qty 1

## 2021-11-17 MED ORDER — FENTANYL CITRATE (PF) 100 MCG/2ML IJ SOLN
INTRAMUSCULAR | Status: DC | PRN
  Administered 2021-11-17 (×2): 50 ug via INTRAVENOUS

## 2021-11-17 MED ORDER — HYDROCODONE-ACETAMINOPHEN 5-325 MG PO TABS
1.0000 | ORAL_TABLET | ORAL | 0 refills | Status: DC | PRN
Start: 2021-11-17 — End: 2022-02-16

## 2021-11-17 MED ORDER — LACTATED RINGERS IV SOLN
INTRAVENOUS | Status: DC
Start: 2021-11-17 — End: 2021-11-17

## 2021-11-17 MED ORDER — POLYVINYL ALCOHOL 1.4 % OP SOLN
1.0000 [drp] | Freq: Every day | OPHTHALMIC | Status: DC
  Filled 2021-11-17: qty 15

## 2021-11-17 MED ORDER — ZOLPIDEM TARTRATE 5 MG PO TABS: 5.0000 mg | ORAL_TABLET | Freq: Every evening | ORAL | Status: DC | PRN

## 2021-11-17 MED ORDER — CEFAZOLIN SODIUM-DEXTROSE 1-4 GM/50ML-% IV SOLN
1.0000 g | Freq: Four times a day (QID) | INTRAVENOUS | Status: AC
  Administered 2021-11-17 (×2): 1 g via INTRAVENOUS
  Filled 2021-11-17 (×2): qty 50

## 2021-11-17 MED ORDER — ALUM & MAG HYDROXIDE-SIMETH 200-200-20 MG/5ML PO SUSP: 30.0000 mL | ORAL | Status: DC | PRN

## 2021-11-17 MED ORDER — ATORVASTATIN CALCIUM 20 MG PO TABS
40.0000 mg | ORAL_TABLET | Freq: Every day | ORAL | Status: DC
  Administered 2021-11-17: 40 mg via ORAL
  Filled 2021-11-17: qty 2

## 2021-11-17 MED ORDER — CHLORHEXIDINE GLUCONATE 0.12 % MT SOLN: 15.0000 mL | Freq: Once | OROMUCOSAL | Status: AC

## 2021-11-17 MED ORDER — MENTHOL 3 MG MT LOZG
1.0000 | LOZENGE | OROMUCOSAL | Status: DC | PRN
  Filled 2021-11-17: qty 9

## 2021-11-17 MED ORDER — ACETAMINOPHEN 325 MG PO TABS: 650.0000 mg | ORAL_TABLET | ORAL | Status: DC | PRN

## 2021-11-17 MED ORDER — DOCUSATE SODIUM 100 MG PO CAPS
100.0000 mg | ORAL_CAPSULE | Freq: Two times a day (BID) | ORAL | Status: DC
  Administered 2021-11-17: 100 mg via ORAL
  Filled 2021-11-17 (×2): qty 1

## 2021-11-17 MED ORDER — LACTATED RINGERS IV BOLUS
250.0000 mL | Freq: Once | INTRAVENOUS | Status: AC
  Administered 2021-11-17: 250 mL via INTRAVENOUS

## 2021-11-17 MED ORDER — FLEET ENEMA 7-19 GM/118ML RE ENEM: 1.0000 | ENEMA | Freq: Once | RECTAL | Status: DC | PRN

## 2021-11-17 MED ORDER — METHOCARBAMOL 500 MG PO TABS
500.0000 mg | ORAL_TABLET | Freq: Four times a day (QID) | ORAL | 0 refills | Status: DC | PRN
Start: 2021-11-17 — End: 2022-02-16

## 2021-11-17 MED ORDER — LACTATED RINGERS IV BOLUS: 500.0000 mL | Freq: Once | INTRAVENOUS | Status: DC

## 2021-11-17 MED ORDER — ESMOLOL HCL 100 MG/10ML IV SOLN
INTRAVENOUS | Status: DC | PRN
  Administered 2021-11-17: 10 mg via INTRAVENOUS

## 2021-11-17 MED ORDER — ACETAMINOPHEN 10 MG/ML IV SOLN
INTRAVENOUS | Status: DC | PRN
  Administered 2021-11-17: 1000 mg via INTRAVENOUS

## 2021-11-17 MED ORDER — 0.9 % SODIUM CHLORIDE (POUR BTL) OPTIME
TOPICAL | Status: DC | PRN
  Administered 2021-11-17: 1000 mL

## 2021-11-17 MED ORDER — METOCLOPRAMIDE HCL 5 MG/ML IJ SOLN: 5.0000 mg | Freq: Three times a day (TID) | INTRAMUSCULAR | Status: DC | PRN

## 2021-11-17 MED ORDER — LAMOTRIGINE 100 MG PO TABS
400.0000 mg | ORAL_TABLET | Freq: Every day | ORAL | Status: DC
  Administered 2021-11-17: 400 mg via ORAL
  Filled 2021-11-17: qty 4

## 2021-11-17 MED ORDER — PHENYLEPHRINE HCL-NACL 20-0.9 MG/250ML-% IV SOLN
INTRAVENOUS | Status: DC | PRN
  Administered 2021-11-17: 25 ug/min via INTRAVENOUS

## 2021-11-17 MED ORDER — CEFAZOLIN SODIUM-DEXTROSE 2-4 GM/100ML-% IV SOLN
INTRAVENOUS | Status: AC
  Filled 2021-11-17: qty 100

## 2021-11-17 MED ORDER — ADULT MULTIVITAMIN W/MINERALS CH
1.0000 | ORAL_TABLET | Freq: Every day | ORAL | Status: DC
  Administered 2021-11-17: 1 via ORAL
  Filled 2021-11-17: qty 1

## 2021-11-17 MED ORDER — AMLODIPINE BESYLATE 5 MG PO TABS
5.0000 mg | ORAL_TABLET | Freq: Every day | ORAL | Status: DC
  Administered 2021-11-17: 5 mg via ORAL
  Filled 2021-11-17: qty 1

## 2021-11-17 MED ORDER — FAMOTIDINE 20 MG PO TABS
ORAL_TABLET | ORAL | Status: AC
Start: 2021-11-17 — End: 2021-11-17
  Administered 2021-11-17: 20 mg via ORAL
  Filled 2021-11-17: qty 1

## 2021-11-17 MED ORDER — ONDANSETRON HCL 4 MG PO TABS: 4.0000 mg | ORAL_TABLET | Freq: Four times a day (QID) | ORAL | 0 refills | Status: DC | PRN

## 2021-11-17 MED ORDER — ACETAMINOPHEN 10 MG/ML IV SOLN
INTRAVENOUS | Status: AC
  Filled 2021-11-17: qty 100

## 2021-11-17 MED ORDER — SENNOSIDES-DOCUSATE SODIUM 8.6-50 MG PO TABS: 1.0000 | ORAL_TABLET | Freq: Every evening | ORAL | Status: DC | PRN

## 2021-11-17 MED ORDER — SODIUM CHLORIDE 0.9 % IV SOLN: INTRAVENOUS | Status: DC

## 2021-11-17 MED ORDER — HYDROCODONE-ACETAMINOPHEN 5-325 MG PO TABS
1.0000 | ORAL_TABLET | ORAL | Status: DC | PRN
  Administered 2021-11-17: 1 via ORAL
  Filled 2021-11-17: qty 1
  Filled 2021-11-17: qty 2

## 2021-11-17 MED ORDER — ALBUTEROL SULFATE (2.5 MG/3ML) 0.083% IN NEBU: 2.5000 mg | INHALATION_SOLUTION | RESPIRATORY_TRACT | Status: DC | PRN

## 2021-11-17 MED ORDER — TRANDOLAPRIL 1 MG PO TABS
2.0000 mg | ORAL_TABLET | Freq: Every day | ORAL | Status: DC
  Administered 2021-11-17: 2 mg via ORAL
  Filled 2021-11-17 (×2): qty 2

## 2021-11-17 MED ORDER — ASPIRIN EC 81 MG PO TBEC
81.0000 mg | DELAYED_RELEASE_TABLET | Freq: Every day | ORAL | Status: DC
Start: 2021-11-17 — End: 2021-11-18
  Administered 2021-11-17: 81 mg via ORAL
  Filled 2021-11-17: qty 1

## 2021-11-17 MED ORDER — LAMOTRIGINE 200 MG PO TABS
200.0000 mg | ORAL_TABLET | Freq: Every day | ORAL | Status: DC
  Filled 2021-11-17: qty 1

## 2021-11-17 MED ORDER — FENTANYL CITRATE (PF) 100 MCG/2ML IJ SOLN
INTRAMUSCULAR | Status: AC
  Filled 2021-11-17: qty 2

## 2021-11-17 MED ORDER — METOCLOPRAMIDE HCL 5 MG PO TABS
5.0000 mg | ORAL_TABLET | Freq: Three times a day (TID) | ORAL | Status: DC | PRN
  Filled 2021-11-17: qty 2

## 2021-11-17 MED ORDER — ENOXAPARIN SODIUM 40 MG/0.4ML IJ SOSY: 40.0000 mg | PREFILLED_SYRINGE | INTRAMUSCULAR | 0 refills | Status: DC

## 2021-11-17 MED ORDER — ACETAMINOPHEN 325 MG PO TABS: 325.0000 mg | ORAL_TABLET | Freq: Four times a day (QID) | ORAL | Status: DC | PRN

## 2021-11-17 MED ORDER — ORAL CARE MOUTH RINSE
15.0000 mL | Freq: Once | OROMUCOSAL | Status: AC
Start: 2021-11-17 — End: 2021-11-17

## 2021-11-17 MED ORDER — ENOXAPARIN SODIUM 40 MG/0.4ML IJ SOSY: 40.0000 mg | PREFILLED_SYRINGE | INTRAMUSCULAR | Status: DC

## 2021-11-17 MED ORDER — BACLOFEN 10 MG PO TABS
20.0000 mg | ORAL_TABLET | Freq: Every day | ORAL | Status: DC
  Administered 2021-11-17: 20 mg via ORAL
  Filled 2021-11-17: qty 2

## 2021-11-17 MED ORDER — PROPOFOL 1000 MG/100ML IV EMUL
INTRAVENOUS | Status: AC
  Filled 2021-11-17: qty 100

## 2021-11-17 MED ORDER — ONDANSETRON HCL 4 MG PO TABS
4.0000 mg | ORAL_TABLET | Freq: Four times a day (QID) | ORAL | Status: DC | PRN
Start: 2021-11-17 — End: 2021-11-18

## 2021-11-17 MED ORDER — BACLOFEN 10 MG PO TABS: 10.0000 mg | ORAL_TABLET | Freq: Every day | ORAL | Status: DC

## 2021-11-17 MED ORDER — PHENOL 1.4 % MT LIQD
1.0000 | OROMUCOSAL | Status: DC | PRN
  Filled 2021-11-17: qty 177

## 2021-11-17 MED ORDER — METHOCARBAMOL 500 MG PO TABS
500.0000 mg | ORAL_TABLET | Freq: Four times a day (QID) | ORAL | Status: DC | PRN
  Administered 2021-11-17 – 2021-11-18 (×3): 500 mg via ORAL
  Filled 2021-11-17 (×3): qty 1

## 2021-11-17 MED ORDER — SURGIPHOR WOUND IRRIGATION SYSTEM - OPTIME: TOPICAL | Status: DC | PRN

## 2021-11-17 MED ORDER — BUPIVACAINE HCL (PF) 0.5 % IJ SOLN
INTRAMUSCULAR | Status: DC | PRN
  Administered 2021-11-17: 2.6 mL via INTRATHECAL

## 2021-11-17 MED ORDER — HYDROCODONE-ACETAMINOPHEN 7.5-325 MG PO TABS
1.0000 | ORAL_TABLET | ORAL | Status: DC | PRN
  Administered 2021-11-18: 1 via ORAL
  Filled 2021-11-17: qty 1

## 2021-11-17 MED ORDER — DOCUSATE SODIUM 100 MG PO CAPS: 100.0000 mg | ORAL_CAPSULE | Freq: Two times a day (BID) | ORAL | 0 refills | Status: DC

## 2021-11-17 MED ORDER — HEMOSTATIC AGENTS (NO CHARGE) OPTIME
TOPICAL | Status: DC | PRN
  Administered 2021-11-17: 2 via TOPICAL

## 2021-11-17 MED ORDER — CHLORHEXIDINE GLUCONATE 0.12 % MT SOLN
OROMUCOSAL | Status: AC
  Administered 2021-11-17: 15 mL via OROMUCOSAL
  Filled 2021-11-17: qty 15

## 2021-11-17 MED ORDER — PROPOFOL 10 MG/ML IV BOLUS
INTRAVENOUS | Status: DC | PRN
  Administered 2021-11-17: 30 mg via INTRAVENOUS
  Administered 2021-11-17: 10 mg via INTRAVENOUS
  Administered 2021-11-17: 40 mg via INTRAVENOUS

## 2021-11-17 MED ORDER — FENTANYL CITRATE (PF) 100 MCG/2ML IJ SOLN: 25.0000 ug | INTRAMUSCULAR | Status: DC | PRN

## 2021-11-17 MED ORDER — PROPOFOL 500 MG/50ML IV EMUL
INTRAVENOUS | Status: DC | PRN
  Administered 2021-11-17: 50 ug/kg/min via INTRAVENOUS

## 2021-11-17 MED ORDER — FAMOTIDINE 20 MG PO TABS
20.0000 mg | ORAL_TABLET | Freq: Once | ORAL | Status: AC
Start: 2021-11-17 — End: 2021-11-17

## 2021-11-17 MED ORDER — SODIUM CHLORIDE (PF) 0.9 % IJ SOLN
INTRAMUSCULAR | Status: DC | PRN
  Administered 2021-11-17: 90 mL via INTRAMUSCULAR

## 2021-11-17 MED ORDER — METHOCARBAMOL 1000 MG/10ML IJ SOLN
500.0000 mg | Freq: Four times a day (QID) | INTRAVENOUS | Status: DC | PRN
  Filled 2021-11-17: qty 5

## 2021-11-17 MED ORDER — ONDANSETRON HCL 4 MG/2ML IJ SOLN: 4.0000 mg | Freq: Four times a day (QID) | INTRAMUSCULAR | Status: DC | PRN

## 2021-11-17 SURGICAL SUPPLY — 58 items
BLADE SAGITTAL AGGR TOOTH XLG (BLADE) ×2 IMPLANT
BNDG COHESIVE 6X5 TAN ST LF (GAUZE/BANDAGES/DRESSINGS) ×6 IMPLANT
BOWL CEMENT MIXING ADV NOZZLE (MISCELLANEOUS) ×1 IMPLANT
CANISTER WOUND CARE 500ML ATS (WOUND CARE) ×2 IMPLANT
CEMENT BONE 40GM (Cement) ×2 IMPLANT
CEMENT RESTRICTOR DEPUY SZ 3 (Cement) ×1 IMPLANT
CHLORAPREP W/TINT 26 (MISCELLANEOUS) ×2 IMPLANT
COVER BACK TABLE REUSABLE LG (DRAPES) ×2 IMPLANT
DRAPE 3/4 80X56 (DRAPES) ×6 IMPLANT
DRAPE C-ARM XRAY 36X54 (DRAPES) ×2 IMPLANT
DRAPE INCISE IOBAN 66X60 STRL (DRAPES) IMPLANT
DRAPE POUCH INSTRU U-SHP 10X18 (DRAPES) ×2 IMPLANT
DRESSING SURGICEL FIBRLLR 1X2 (HEMOSTASIS) ×2 IMPLANT
DRSG MEPILEX SACRM 8.7X9.8 (GAUZE/BANDAGES/DRESSINGS) ×2 IMPLANT
DRSG SURGICEL FIBRILLAR 1X2 (HEMOSTASIS) ×4
ELECT BLADE 6.5 EXT (BLADE) ×2 IMPLANT
ELECT REM PT RETURN 9FT ADLT (ELECTROSURGICAL) ×2
ELECTRODE REM PT RTRN 9FT ADLT (ELECTROSURGICAL) ×1 IMPLANT
GLOVE SURG SYN 9.0  PF PI (GLOVE) ×2
GLOVE SURG SYN 9.0 PF PI (GLOVE) ×2 IMPLANT
GLOVE SURG UNDER POLY LF SZ9 (GLOVE) ×2 IMPLANT
GOWN SRG 2XL LVL 4 RGLN SLV (GOWNS) ×1 IMPLANT
GOWN STRL NON-REIN 2XL LVL4 (GOWNS) ×1
GOWN STRL REUS W/ TWL LRG LVL3 (GOWN DISPOSABLE) ×1 IMPLANT
GOWN STRL REUS W/TWL LRG LVL3 (GOWN DISPOSABLE) ×1
HIP FEM HD M 28 (Head) ×1 IMPLANT
HIP STEM FEM 2 STD (Stem) ×1 IMPLANT
HOLDER FOLEY CATH W/STRAP (MISCELLANEOUS) ×2 IMPLANT
HOOD PEEL AWAY FLYTE STAYCOOL (MISCELLANEOUS) ×2 IMPLANT
KIT PREVENA INCISION MGT 13 (CANNISTER) ×2 IMPLANT
KNIFE SCULPS 14X20 (INSTRUMENTS) ×1 IMPLANT
LINER DUAL MOB 50MM (Liner) ×1 IMPLANT
MANIFOLD NEPTUNE II (INSTRUMENTS) ×2 IMPLANT
MAT ABSORB  FLUID 56X50 GRAY (MISCELLANEOUS) ×1
MAT ABSORB FLUID 56X50 GRAY (MISCELLANEOUS) ×1 IMPLANT
NDL SPNL 20GX3.5 QUINCKE YW (NEEDLE) ×2 IMPLANT
NEEDLE SPNL 20GX3.5 QUINCKE YW (NEEDLE) ×4 IMPLANT
NS IRRIG 1000ML POUR BTL (IV SOLUTION) ×2 IMPLANT
PACK HIP COMPR (MISCELLANEOUS) ×2 IMPLANT
PRESSURIZER CEMENT PROX FEM SM (MISCELLANEOUS) ×1 IMPLANT
SCALPEL PROTECTED #10 DISP (BLADE) ×4 IMPLANT
SHELL ACETABULAR SZ0 50 DME (Shell) ×1 IMPLANT
SOLUTION IRRIG SURGIPHOR (IV SOLUTION) ×1 IMPLANT
STAPLER SKIN PROX 35W (STAPLE) ×2 IMPLANT
STRAP SAFETY 5IN WIDE (MISCELLANEOUS) ×2 IMPLANT
SUT DVC 2 QUILL PDO  T11 36X36 (SUTURE) ×1
SUT DVC 2 QUILL PDO T11 36X36 (SUTURE) ×1 IMPLANT
SUT SILK 0 (SUTURE) ×1
SUT SILK 0 30XBRD TIE 6 (SUTURE) ×1 IMPLANT
SUT V-LOC 90 ABS DVC 3-0 CL (SUTURE) ×2 IMPLANT
SUT VIC AB 1 CT1 36 (SUTURE) ×2 IMPLANT
SYR 20ML LL LF (SYRINGE) ×1 IMPLANT
SYR 30ML LL (SYRINGE) ×2 IMPLANT
SYR 50ML LL SCALE MARK (SYRINGE) ×4 IMPLANT
SYR BULB IRRIG 60ML STRL (SYRINGE) ×2 IMPLANT
TAPE MICROFOAM 4IN (TAPE) ×2 IMPLANT
TOWEL OR 17X26 4PK STRL BLUE (TOWEL DISPOSABLE) IMPLANT
TRAY FOLEY MTR SLVR 16FR STAT (SET/KITS/TRAYS/PACK) ×2 IMPLANT

## 2021-11-17 NOTE — Transfer of Care (Signed)
Immediate Anesthesia Transfer of Care Note ? ?Patient: Sarah Cunningham ? ?Procedure(s) Performed: TOTAL HIP ARTHROPLASTY ANTERIOR APPROACH (Left: Hip) ? ?Patient Location: PACU ? ?Anesthesia Type:Spinal ? ?Level of Consciousness: awake, alert  and oriented ? ?Airway & Oxygen Therapy: Patient Spontanous Breathing and Patient connected to face mask oxygen ? ?Post-op Assessment: Report given to RN and Post -op Vital signs reviewed and stable ? ?Post vital signs: Reviewed and stable ? ?Last Vitals:  ?Vitals Value Taken Time  ?BP 112/72 11/17/21 1230  ?Temp    ?Pulse 73 11/17/21 1235  ?Resp 16 11/17/21 1235  ?SpO2 100 % 11/17/21 1235  ?Vitals shown include unvalidated device data. ? ?Last Pain:  ?Vitals:  ? 11/17/21 0758  ?TempSrc: Oral  ?PainSc: 6   ?   ? ?  ? ?Complications: No notable events documented. ?

## 2021-11-17 NOTE — Anesthesia Procedure Notes (Signed)
Spinal ? ?Patient location during procedure: OR ?Start time: 11/17/2021 10:28 AM ?End time: 11/17/2021 10:38 AM ?Reason for block: surgical anesthesia ?Staffing ?Performed: resident/CRNA  ?Anesthesiologist: Karenz, Andrew, MD ?Resident/CRNA: Bachich, Jennifer, CRNA ?Preanesthetic Checklist ?Completed: patient identified, IV checked, site marked, risks and benefits discussed, surgical consent, monitors and equipment checked, pre-op evaluation and timeout performed ?Spinal Block ?Patient position: sitting ?Prep: ChloraPrep ?Patient monitoring: heart rate, continuous pulse ox, blood pressure and cardiac monitor ?Approach: midline ?Location: L3-4 ?Injection technique: single-shot ?Needle ?Needle type: Whitacre and Introducer  ?Needle gauge: 25 G ?Needle length: 9 cm ?Assessment ?Sensory level: T10 ?Events: CSF return ?Additional Notes ?Sterile aseptic technique used throughout the procedure.  Negative paresthesia. Negative blood return. Positive free-flowing CSF. Expiration date of kit checked and confirmed. Patient tolerated procedure well, without complications. ? ? ? ? ? ?

## 2021-11-17 NOTE — TOC Initial Note (Addendum)
Transition of Care (TOC) - Initial/Assessment Note  ? ? ?Patient Details  ?Name: Sarah Cunningham ?MRN: [id number] ?Date of Birth: January 16, 1946 ? ?Transition of Care (TOC) CM/SW Contact:    ?Anselm Pancoast, RN ?Phone Number: ?11/17/2021, 4:42 PM ? ?Clinical Narrative:                 ?Notified by unit staff patient was ready for discharge and in need of transportation. Patient prearranged with [facility] and has no equipment needs. Therapy has met with patient and discussed discharge needs. Patient is not appropriate for taxi due to being post op less than 24 hours. Patient will need friend/family to transport or will need to remain in hospital in order to safely transport via taxi. RN and PT are aware of restrictions. Per therapy patient lives alone but has neighbor who will be assisting as needed.  ? ?  ?  ? ? ?Patient Goals and CMS Choice ?  ?  ?  ? ?Expected Discharge Plan and Services ?  ?  ?  ?  ?  ?Expected Discharge Date: 11/17/21               ?  ?  ?  ?  ?  ?  ?  ?  ?  ?  ? ?Prior Living Arrangements/Services ?  ?  ?  ?       ?  ?  ?  ?  ? ?Activities of Daily Living ?Home Assistive Devices/Equipment: Cane (specify quad or straight) (straight) ?ADL Screening (condition at time of admission) ?Patient's cognitive ability adequate to safely complete daily activities?: Yes ?Is the patient deaf or have difficulty hearing?: No ?Does the patient have difficulty seeing, even when wearing glasses/contacts?: No ?Does the patient have difficulty concentrating, remembering, or making decisions?: No ?Patient able to express need for assistance with ADLs?: Yes ?Does the patient have difficulty dressing or bathing?: No ?Independently performs ADLs?: Yes (appropriate for developmental age) ?Does the patient have difficulty walking or climbing stairs?: Yes ?Weakness of Legs: Both ?Weakness of Arms/Hands: None ? ?Permission Sought/Granted ?  ?  ?   ?   ?   ?   ? ?Emotional Assessment ?  ?  ?  ?  ?  ?  ? ?Admission diagnosis:   Status post total hip replacement, left [Z96.642] ?Patient Active Problem List  ? Diagnosis Date Noted  ? Status post total hip replacement, left 11/17/2021  ? Trigeminal neuralgia 11/19/2016  ? Osteopenia of multiple sites 10/29/2016  ? Tobacco dependence 10/22/2016  ? Essential hypertension 04/09/2016  ? Pure hypercholesterolemia 04/09/2016  ? Vaccine counseling 04/09/2016  ? ?PCP:  Gladstone Lighter, MD ?Pharmacy:   ?[facility] [id number] ?[place] ?[place] [address] ?Phone: [phone] Fax: [phone] ? ? ? ? ?Social Determinants of Health (SDOH) Interventions ?  ? ?Readmission Risk Interventions ?   ? View : No data to display.  ?  ?  ?  ? ? ? ?

## 2021-11-17 NOTE — Anesthesia Preprocedure Evaluation (Signed)
Anesthesia Evaluation  ?Patient identified by MRN, date of birth, ID band ?Patient awake ? ? ? ?Reviewed: ?Allergy & Precautions, NPO status , Patient's Chart, lab work & pertinent test results ? ?History of Anesthesia Complications ?Negative for: history of anesthetic complications ? ?Airway ?Mallampati: III ? ? ?Neck ROM: Full ? ? ? Dental ? ?(+) Poor Dentition, Dental Advidsory Given ?  ?Pulmonary ?neg shortness of breath, neg COPD, neg recent URI, Current Smoker and Patient abstained from smoking.,  ?  ?Pulmonary exam normal ?breath sounds clear to auscultation ? ? ? ? ? ? Cardiovascular ?hypertension, (-) angina(-) Past MI Normal cardiovascular exam(-) dysrhythmias  ?Rhythm:Regular Rate:Normal ? ?Echo 10/12/18:  ?NORMAL LEFT VENTRICULAR SYSTOLIC FUNCTION  ?NORMAL RIGHT VENTRICULAR SYSTOLIC FUNCTION  ?MILD VALVULAR REGURGITATION   ?NO VALVULAR STENOSIS ?  ?Neuro/Psych ?neg Seizures  Neuromuscular disease (trigeminal neuralgia)   ? GI/Hepatic ?negative GI ROS,   ?Endo/Other  ?negative endocrine ROS ? Renal/GU ?negative Renal ROS  ? ?  ?Musculoskeletal ? ?(+) Arthritis ,  ? Abdominal ?  ?Peds ? Hematology ?negative hematology ROS ?(+)   ?Anesthesia Other Findings ?Past Medical History: ?2013: Arthritis ?    Comment:  rt hip ?No date: HLD (hyperlipidemia) ?No date: Hypertension ?No date: Tobacco dependence ?No date: Trigeminal neuralgia ? ? Reproductive/Obstetrics ? ?  ? ? ? ? ? ? ? ? ? ? ? ? ? ?  ?  ? ? ? ? ? ? ? ? ?Anesthesia Physical ? ?Anesthesia Plan ? ?ASA: 2 ? ?Anesthesia Plan: General  ? ?Post-op Pain Management:   ? ?Induction: Intravenous ? ?PONV Risk Score and Plan: 2 and Propofol infusion, TIVA and Treatment may vary due to age or medical condition ? ?Airway Management Planned: Natural Airway and Nasal Cannula ? ?Additional Equipment:  ? ?Intra-op Plan:  ? ?Post-operative Plan:  ? ?Informed Consent: I have reviewed the patients History and Physical, chart, labs and  discussed the procedure including the risks, benefits and alternatives for the proposed anesthesia with the patient or authorized representative who has indicated his/her understanding and acceptance.  ? ?Patient has DNR.  ?Discussed DNR with patient and Continue DNR. ?  ? ? ?Plan Discussed with: CRNA ? ?Anesthesia Plan Comments: (LMA/GETA backup discussed.  Patient consented for risks of anesthesia including but not limited to:  ?- adverse reactions to medications ?- damage to eyes, teeth, lips or other oral mucosa ?- nerve damage due to positioning  ?- sore throat or hoarseness ?- damage to heart, brain, nerves, lungs, other parts of body or loss of life ? ?Informed patient about role of CRNA in peri- and intra-operative care.  Patient voiced understanding.)  ? ? ? ? ? ? ?Anesthesia Quick Evaluation ? ?

## 2021-11-17 NOTE — Discharge Instructions (Signed)
?ANTERIOR APPROACH TOTAL HIP REPLACEMENT POSTOPERATIVE DIRECTIONS ? ? ?Hip Rehabilitation, Guidelines Following Surgery  ?The results of a hip operation are greatly improved after range of motion and muscle strengthening exercises. Follow all safety measures which are given to protect your hip. If any of these exercises cause increased pain or swelling in your joint, decrease the amount until you are comfortable again. Then slowly increase the exercises. Call your caregiver if you have problems or questions.  ? ?HOME CARE INSTRUCTIONS  ?Remove items at home which could result in a fall. This includes throw rugs or furniture in walking pathways.  ?ICE to the affected hip every three hours for 30 minutes at a time and then as needed for pain and swelling.  Continue to use ice on the hip for pain and swelling from surgery. You may notice swelling that will progress down to the foot and ankle.  This is normal after surgery.  Elevate the leg when you are not up walking on it.   ?Continue to use the breathing machine which will help keep your temperature down.  It is common for your temperature to cycle up and down following surgery, especially at night when you are not up moving around and exerting yourself.  The breathing machine keeps your lungs expanded and your temperature down. ?Do not place pillow under knee, focus on keeping the knee straight while resting ? ?DIET ?You may resume your previous home diet once your are discharged from the hospital. ? ?DRESSING / WOUND CARE / SHOWERING ?Please remove provena negative pressure dressing on 11/24/2021 and apply honey comb dressing. Keep dressing clean and dry at all times.  ? ?ACTIVITY ?Walk with your walker as instructed. ?Use walker as long as suggested by your caregivers. ?Avoid periods of inactivity such as sitting longer than an hour when not asleep. This helps prevent blood clots.  ?You may resume a sexual relationship in one month or when given the OK by your  doctor.  ?You may return to work once you are cleared by your doctor.  ?Do not drive a car for 6 weeks or until released by you surgeon.  ?Do not drive while taking narcotics. ? ?WEIGHT BEARING ?Weight bearing as tolerated. Use walker/cane as needed for at least 4 weeks post op. ? ?POSTOPERATIVE CONSTIPATION PROTOCOL ?Constipation - defined medically as fewer than three stools per week and severe constipation as less than one stool per week. ? ?One of the most common issues patients have following surgery is constipation.  Even if you have a regular bowel pattern at home, your normal regimen is likely to be disrupted due to multiple reasons following surgery.  Combination of anesthesia, postoperative narcotics, change in appetite and fluid intake all can affect your bowels.  In order to avoid complications following surgery, here are some recommendations in order to help you during your recovery period. ? ?Colace (docusate) - Pick up an over-the-counter form of Colace or another stool softener and take twice a day as long as you are requiring postoperative pain medications.  Take with a full glass of water daily.  If you experience loose stools or diarrhea, hold the colace until you stool forms back up.  If your symptoms do not get better within 1 week or if they get worse, check with your doctor. ? ?Dulcolax (bisacodyl) - Pick up over-the-counter and take as directed by the product packaging as needed to assist with the movement of your bowels.  Take with a full glass of  water.  Use this product as needed if not relieved by Colace only.  ? ?MiraLax (polyethylene glycol) - Pick up over-the-counter to have on hand.  MiraLax is a solution that will increase the amount of water in your bowels to assist with bowel movements.  Take as directed and can mix with a glass of water, juice, soda, coffee, or tea.  Take if you go more than two days without a movement. ?Do not use MiraLax more than once per day. Call your doctor  if you are still constipated or irregular after using this medication for 7 days in a row. ? ?If you continue to have problems with postoperative constipation, please contact the office for further assistance and recommendations.  If you experience "the worst abdominal pain ever" or develop nausea or vomiting, please contact the office immediatly for further recommendations for treatment. ? ?ITCHING ? If you experience itching with your medications, try taking only a single pain pill, or even half a pain pill at a time.  You can also use Benadryl over the counter for itching or also to help with sleep.  ? ?TED HOSE STOCKINGS ?Wear the elastic stockings on both legs for six weeks following surgery during the day but you may remove then at night for sleeping. ? ?MEDICATIONS ?See your medication summary on the ?After Visit Summary? that the nursing staff will review with you prior to discharge.  You may have some home medications which will be placed on hold until you complete the course of blood thinner medication.  It is important for you to complete the blood thinner medication as prescribed by your surgeon.  Continue your approved medications as instructed at time of discharge. ? ?PRECAUTIONS ?If you experience chest pain or shortness of breath - call 911 immediately for transfer to the hospital emergency department.  ?If you develop a fever greater that 101 F, purulent drainage from wound, increased redness or drainage from wound, foul odor from the wound/dressing, or calf pain - CONTACT YOUR SURGEON.   ?                                                ?FOLLOW-UP APPOINTMENTS ?Make sure you keep all of your appointments after your operation with your surgeon and caregivers. You should call the office at the above phone number and make an appointment for approximately two weeks after the date of your surgery or on the date instructed by your surgeon outlined in the "After Visit Summary". ? ?RANGE OF MOTION AND  STRENGTHENING EXERCISES  ?These exercises are designed to help you keep full movement of your hip joint. Follow your caregiver's or physical therapist's instructions. Perform all exercises about fifteen times, three times per day or as directed. Exercise both hips, even if you have had only one joint replacement. These exercises can be done on a training (exercise) mat, on the floor, on a table or on a bed. Use whatever works the best and is most comfortable for you. Use music or television while you are exercising so that the exercises are a pleasant break in your day. This will make your life better with the exercises acting as a break in routine you can look forward to.  ?Lying on your back, slowly slide your foot toward your buttocks, raising your knee up off the floor. Then slowly slide your  foot back down until your leg is straight again.  ?Lying on your back spread your legs as far apart as you can without causing discomfort.  ?Lying on your side, raise your upper leg and foot straight up from the floor as far as is comfortable. Slowly lower the leg and repeat.  ?Lying on your back, tighten up the muscle in the front of your thigh (quadriceps muscles). You can do this by keeping your leg straight and trying to raise your heel off the floor. This helps strengthen the largest muscle supporting your knee.  ?Lying on your back, tighten up the muscles of your buttocks both with the legs straight and with the knee bent at a comfortable angle while keeping your heel on the floor.  ? ?IF YOU ARE TRANSFERRED TO A SKILLED REHAB FACILITY ?If the patient is transferred to a skilled rehab facility following release from the hospital, a list of the current medications will be sent to the facility for the patient to continue.  When discharged from the skilled rehab facility, please have the facility set up the patient's Home Health Physical Therapy prior to being released. Also, the skilled facility will be responsible for  providing the patient with their medications at time of release from the facility to include their pain medication, the muscle relaxants, and their blood thinner medication. If the patient is still at the reh

## 2021-11-17 NOTE — Evaluation (Signed)
Physical Therapy Evaluation ?Patient Details ?Name: Sarah Cunningham ?MRN: [id number] ?DOB: [birth year] ?Today's Date: 11/17/2021 ? ?History of Present Illness ? 76 y/o female s/p L total hip (anterior approach) 11/17/21  ?Clinical Impression ? Pt eager to do all she can with PT and show that she is safe to go home tomorrow.  She is still not having a lot of pain post op and was therefore able to tolerate nearly 200 ft of ambulation with good speed, safety and confidence (along with minimal UE reliance) as well as negotiate up/down 4 steps.  Additionally she did very well with exercises, and showed good understanding with HEP expectations.  Pt reports she has a neighbor who can assist PRN, unsure how regular this truly will be but ultimately pt did do relatively well and though she will need some assist appears that she will not require an excessive amount to manage at home.   ?   ? ?Recommendations for follow up therapy are one component of a multi-disciplinary discharge planning process, led by the attending physician.  Recommendations may be updated based on patient status, additional functional criteria and insurance authorization. ? ?Follow Up Recommendations Follow physician's recommendations for discharge plan and follow up therapies ? ?  ?Assistance Recommended at Discharge Intermittent Supervision/Assistance  ?Patient can return home with the following ? Assistance with cooking/housework;Assist for transportation;Help with stairs or ramp for entrance ? ?  ?Equipment Recommendations None recommended by PT  ?Recommendations for Other Services ?    ?  ?Functional Status Assessment Patient has had a recent decline in their functional status and demonstrates the ability to make significant improvements in function in a reasonable and predictable amount of time.  ? ?  ?Precautions / Restrictions Precautions ?Precautions: Anterior Hip;Fall ?Restrictions ?Weight Bearing Restrictions: Yes ?LLE Weight Bearing: Weight  bearing as tolerated  ? ?  ? ?Mobility ? Bed Mobility ?Overal bed mobility: Modified Independent ?  ?  ?  ?  ?  ?  ?  ?  ? ?Transfers ?Overall transfer level: Modified independent ?Equipment used: Rolling walker (2 wheels) ?  ?  ?  ?  ?  ?  ?  ?General transfer comment: Pt was able to rise w/o hesitation or excessive use of UEs. Safe and efficient effort. ?  ? ?Ambulation/Gait ?Ambulation/Gait assistance: Supervision ?Gait Distance (Feet): 175 Feet ?Assistive device: Rolling walker (2 wheels) ?  ?  ?  ?  ?General Gait Details: Pt was able to quickly assume consistent and confident cadence with no hesitation with WBing and very little reliance on the walker. ? ?Stairs ?Stairs: Yes ?Stairs assistance: Supervision ?Stair Management: One rail Right, Sideways ?Number of Stairs: 4 ?General stair comments: Educated on and discussed multiple strategies for stair negotiation; pt moving L LE well enough that she preferred leading with L so she could utilize R rail more confidently ? ?Wheelchair Mobility ?  ? ?Modified Rankin (Stroke Patients Only) ?  ? ?  ? ?Balance Overall balance assessment: Modified Independent ?  ?  ?  ?  ?  ?  ?  ?  ?  ?  ?  ?  ?  ?  ?  ?  ?  ?  ?   ? ? ? ?Pertinent Vitals/Pain Pain Assessment ?Pain Assessment: 0-10 ?Pain Score: 1  ?Pain Location: anterior hip sore  ? ? ?Home Living Family/patient expects to be discharged to:: Private residence ?Living Arrangements: Alone ?Available Help at Discharge: Neighbor;Available PRN/intermittently ?  ?Home Access: Stairs to enter ?  Entrance Stairs-Rails: Right ?Entrance Stairs-Number of Steps: 3 ?  ?  ?Home Equipment: Conservation officer, nature (2 wheels);Cane - single point ?   ?  ?Prior Function Prior Level of Function : Independent/Modified Independent ?  ?  ?  ?  ?  ?  ?Mobility Comments: Pt reports she has been using a cane due to R hip pain, but that she is active and independent ?ADLs Comments: lives alone, manages home ?  ? ? ?Hand Dominance  ?   ? ?  ?Extremity/Trunk  Assessment  ? Upper Extremity Assessment ?Upper Extremity Assessment: Overall WFL for tasks assessed ?  ? ?Lower Extremity Assessment ?Lower Extremity Assessment: Overall WFL for tasks assessed (good post-op strength, even able to SLR on L) ?  ? ?   ?Communication  ? Communication: No difficulties  ?Cognition Arousal/Alertness: Awake/alert ?Behavior During Therapy: [facility] for tasks assessed/performed ?Overall Cognitive Status: Within Functional Limits for tasks assessed ?  ?  ?  ?  ?  ?  ?  ?  ?  ?  ?  ?  ?  ?  ?  ?  ?  ?  ?  ? ?  ?General Comments   ? ?  ?Exercises Total Joint Exercises ?Ankle Circles/Pumps: AROM, 10 reps ?Quad Sets: Strengthening, 10 reps ?Heel Slides: Strengthening, 10 reps (with resitsed leg ext) ?Hip ABduction/ADduction: Strengthening, 10 reps ?Straight Leg Raises: AROM, 5 reps  ? ?Assessment/Plan  ?  ?PT Assessment Patient needs continued PT services  ?PT Problem List Decreased strength;Decreased range of motion;Decreased activity tolerance;Decreased balance;Decreased mobility;Decreased safety awareness;Decreased knowledge of use of DME;Decreased knowledge of precautions;Pain ? ?   ?  ?PT Treatment Interventions DME instruction;Gait training;Stair training;Functional mobility training;Therapeutic activities;Therapeutic exercise;Balance training;Neuromuscular re-education;Patient/family education   ? ?PT Goals (Current goals can be found in the Care Plan section)  ?  ? ?  ?Frequency BID ?  ? ? ?Co-evaluation   ?  ?  ?  ?  ? ? ?  ?AM-PAC PT "6 Clicks" Mobility  ?Outcome Measure Help needed turning from your back to your side while in a flat bed without using bedrails?: None ?Help needed moving from lying on your back to sitting on the side of a flat bed without using bedrails?: None ?Help needed moving to and from a bed to a chair (including a wheelchair)?: None ?Help needed standing up from a chair using your arms (e.g., wheelchair or bedside chair)?: None ?Help needed to walk in hospital room?:  None ?Help needed climbing 3-5 steps with a railing? : A Little ?6 Click Score: 23 ? ?  ?End of Session Equipment Utilized During Treatment: Gait belt ?Activity Tolerance: Patient tolerated treatment well ?Patient left: in chair;with call bell/phone within reach;with nursing/sitter in room ?Nurse Communication: Mobility status ?PT Visit Diagnosis: Muscle weakness (generalized) (M62.81);Difficulty in walking, not elsewhere classified (R26.2);Pain ?Pain - Right/Left: Left ?Pain - part of body: Hip ?  ? ?Time: 1510-1600 ?PT Time Calculation (min) (ACUTE ONLY): 50 min ? ? ?Charges:   PT Evaluation ?$PT Eval Low Complexity: 1 Low ?PT Treatments ?$Gait Training: 8-22 mins ?$Therapeutic Exercise: 8-22 mins ?  ?   ? ? ?Kreg Shropshire, DPT ?11/17/2021, 6:02 PM ? ?

## 2021-11-17 NOTE — H&P (Signed)
Chief Complaint  ?Patient presents with  ? Pre-op Exam  ?Scheduled for Left THA 11/17/21  ? ? ?History of the Present Illness: ?Sarah Cunningham is a 76 y.o. female here today for history and physical for left total hip arthroplasty with Dr. Hessie Knows on 11/17/2021. Patient has advanced degenerative changes of the left hip with avascular necrosis with deformity to the femoral head. Pain is severe. She is unable to walk without using assistive devices such as a cane or a cart to help with ambulation. She is unable to lean forward and pick something up off the ground. She has severe pain with hip flexion. She is taken ibuprofen 200 mg in the morning and 200 mg at night which gives her minimal relief. Patient had x-rays most recently in April 2023 showing complete loss of joint space of the left hip joint with signs of AVN with central joint space narrowing. ? ?Her creatinine is 1.9. Her calculated GFR is 61. ? ?The patient lives in [place], [place]. She has 3 steps to enter her house. She does not have help at home. ? ?I have reviewed past medical, surgical, social and family history, and allergies as documented in the EMR. ? ?Past Medical History: ?Past Medical History:  ?Diagnosis Date  ? Arthritis 2013  ?right hip  ? Hyperlipidemia  ? Hypertension  ? Osteoporosis  ? Trigeminal neuralgia  ? ?Past Surgical History: ?Past Surgical History:  ?Procedure Laterality Date  ? APPENDECTOMY 1985  ? Toenail removal x 3 11/2015  ? COLONOSCOPY 09/15/2021  ?Tubular adenomas/Hyperplastic polyps  ? CATARACT EXTRACTION Bilateral  ?11/03/15 & 11/10/15  ? ?Past Family History: ?Family History  ?Problem Relation Age of Onset  ? Lung cancer Mother  ? Cancer Mother  ? Other Father  ?Brain tumor  ? Brain cancer Father  ? Diabetes type II Sister  ? Diabetes Sister  ? Diabetes type II Brother  ? Diabetes Brother  ? COPD Brother  ? Myocardial Infarction (Heart attack) Maternal Grandmother  ? Myocardial Infarction (Heart attack) Paternal  Grandmother  ? Myocardial Infarction (Heart attack) Paternal Grandfather  ? Kidney disease Sister  ? Kidney disease Sister  ? ?Medications: ?Current Outpatient Medications Ordered in Epic  ?Medication Sig Dispense Refill  ? albuterol (PROAIR HFA) 90 mcg/actuation inhaler Inhale 2 inhalations into the lungs every 4 (four) hours as needed for Wheezing 8.5 g 3  ? alendronate (FOSAMAX) 70 MG tablet Take 1 tablet (70 mg total) by mouth every 7 (seven) days Take with a full glass of water. Do not lie down for the next 30 min. 4 tablet 11  ? amLODIPine (NORVASC) 5 MG tablet TAKE ONE TABLET BY MOUTH DAILY 90 tablet 1  ? antiox #8/om3/dha/epa/lut/zeax (PRESERVISION AREDS 2, OMEGA-3, ORAL) Take 1 tablet by mouth 2 (two) times daily  ? aspirin 81 MG EC tablet Take 1 tablet (81 mg total) by mouth once daily 90 tablet 1  ? atorvastatin (LIPITOR) 40 MG tablet TAKE ONE TABLET BY MOUTH DAILY 90 tablet 1  ? baclofen (LIORESAL) 10 MG tablet Take 1 tablet (10 mg total) by mouth 3 (three) times daily 240 tablet 1  ? benazepriL (LOTENSIN) 10 MG tablet Take 1 tablet (10 mg total) by mouth once daily 30 tablet 11  ? ibuprofen (ADVIL,MOTRIN) 200 MG tablet Take 400 mg by mouth 2 (two) times daily as needed for Pain  ? lamoTRIgine (LAMICTAL) 200 MG tablet 200 mg in the morning and 400 mg nightly. 225 tablet 3  ? multivitamin  tablet Take 1 tablet by mouth once daily  ? traMADoL (ULTRAM) 50 mg tablet Take 1 tablet (50 mg total) by mouth every 8 (eight) hours as needed for Pain for up to 7 days 20 tablet 0  ? ?No current Epic-ordered facility-administered medications on file.  ? ?Allergies: ?Allergies  ?Allergen Reactions  ? Tegretol [Carbamazepine] Rash  ? ? ?Body mass index is 19.63 kg/m?. ? ?Review of Systems: ?A comprehensive 14 point ROS was performed, reviewed, and the pertinent orthopaedic findings are documented in the HPI. ? ?Vitals:  ?11/10/21 1408  ?BP: (!) 158/84  ? ? ?General Physical Examination:  ?General:  ?Well developed, well  nourished, no apparent distress, normal affect, and gait with a cane ? ?HEENT: ?Head normocephalic, atraumatic, PERRL.  ? ?Abdomen: ?Soft, non tender, non distended, Bowel sounds present. ? ?Heart: ?Examination of the heart reveals regular, rate, and rhythm. There is no murmur noted on ascultation. There is a normal apical pulse. ? ?Lungs: ?Lungs are clear to auscultation. There is no wheeze, rhonchi, or crackles. There is normal expansion of bilateral chest walls.  ? ?Left hip: ?On exam, left hip internal rotation is 10 degrees, external is 20 degrees. Right hip internal rotation is 10 degrees, external is 25 degrees. ? ?Radiographs: ?AP pelvis and lateral x-rays of the bilateral hips were reviewed by me today from 10/12/2021. These show severe L5-S1 degenerative disc disease with complete loss of disc space, bilateral SI joint arthritis, advanced. Left hip shows complete loss of superior joint space with some deformity to the head consistent with avascular necrosis. On the lateral of the left, there is subchondral cyst formation and possible AVN with collapse. On the right side, there is calcification and cyst formation that could be AVN or just severe arthritis of the right hip. Lateral view shows what appears to be avascular necrosis and possibly an old neck fracture with complete loss of joint space. ? ?X-ray Impression: ?Advanced bilateral severe hip arthritis, left worse than right, with some element of avascular necrosis. ? ?Assessment: ?ICD-10-CM  ?1. Avascular necrosis (CMS-HCC) M87.00  ?2. Osteonecrosis of left hip (CMS-HCC) M87.9  ? ?Plan: ?37. 76 year old female with severe left hip avascular necrosis with deformity to the femoral head and complete loss of joint space. Patient has severe debilitating pain that interferes with her quality of life and activities daily living. Risks, benefits, complications of a left total hip arthroplasty have been discussed with the patient. Patient has agreed and  consented procedure with Dr. Hessie Knows on 11/17/2021. ? ? ?Electronically signed by Feliberto Gottron, PA at 11/10/2021 2:57 PM EDT ? ?Reviewed  H+P. ?No changes noted. ? ? ?

## 2021-11-17 NOTE — Discharge Summary (Addendum)
?Physician Discharge Summary  ?Patient ID: ?Sarah Cunningham ?MRN: [id number] ?DOB/AGE: [birth year] 78 y.o. ? ?Admit date: 11/17/2021 ?Discharge date: 11/18/2021 ? ?Admission Diagnoses:  ?Status post total hip replacement, left [Z96.642] ? ? ?Discharge Diagnoses: ?Patient Active Problem List  ? Diagnosis Date Noted  ? Status post total hip replacement, left 11/17/2021  ? Trigeminal neuralgia 11/19/2016  ? Osteopenia of multiple sites 10/29/2016  ? Tobacco dependence 10/22/2016  ? Essential hypertension 04/09/2016  ? Pure hypercholesterolemia 04/09/2016  ? Vaccine counseling 04/09/2016  ? ? ?Past Medical History:  ?Diagnosis Date  ? Arthritis 2013  ? rt hip  ? HLD (hyperlipidemia)   ? Hypertension   ? Tobacco dependence   ? Trigeminal neuralgia   ? ?  ?Transfusion: none ?  ?Consultants (if any):  ? ?Discharged Condition: Improved ? ?Hospital Course: Sarah Cunningham is an 76 y.o. female who was admitted 11/17/2021 with a diagnosis of Status post total hip replacement, left and went to the operating room on 11/17/2021 and underwent the above named procedures.  ?  ?Surgeries: Procedure(s): ?TOTAL HIP ARTHROPLASTY ANTERIOR APPROACH on 11/17/2021 ?Patient tolerated the surgery well. Taken to PACU where she was stabilized and then transferred to the orthopedic floor. ? ?Started on Lovenox 40 mg q 24 hrs. Foot pumps applied bilaterally at 80 mm. Heels elevated on bed with rolled towels. No evidence of DVT. Negative Homan. ?Physical therapy started on day of surgery for gait training and transfer. OT started day #1 for ADL and assisted devices. ? ?Patient's foley was d/c on day #0.  Patient was stable and ready for discharge on day of surgery but unfortunately the patient did not have transportation home so she spent the night overnight.  Patient's IV was d/c on day #1. ? ?On post op day #1 patient was stable and ready for discharge to home with HHPT. ? ?She was given perioperative antibiotics:  ?Anti-infectives (From admission,  onward)  ? ? Start     Dose/Rate Route Frequency Ordered Stop  ? 11/17/21 1700  ceFAZolin (ANCEF) IVPB 1 g/50 mL premix       ? 1 g ?100 mL/hr over 30 Minutes Intravenous Every 6 hours 11/17/21 1410 11/17/21 2323  ? 11/17/21 0853  ceFAZolin (ANCEF) 2-4 GM/100ML-% IVPB       ?Note to Pharmacy: Sylvester Harder P: cabinet override  ?    11/17/21 0853 11/17/21 1050  ? 11/17/21 0600  ceFAZolin (ANCEF) IVPB 2g/100 mL premix       ? 2 g ?200 mL/hr over 30 Minutes Intravenous On call to O.R. 11/16/21 2340 11/17/21 1059  ? ?  ?. ? ?She was given sequential compression devices, early ambulation, and Lovenox TEDs for DVT prophylaxis. ? ?She benefited maximally from the hospital stay and there were no complications.   ? ?Recent vital signs:  ?Vitals:  ? 11/17/21 2327 11/18/21 0623  ?BP: 111/71 120/76  ?Pulse: 83 80  ?Resp:  18  ?Temp: (!) 97.4 ?F (36.3 ?C) 97.8 ?F (36.6 ?C)  ?SpO2: 96% 95%  ? ? ?Recent laboratory studies:  ?Lab Results  ?Component Value Date  ? HGB 11.6 (L) 11/18/2021  ? HGB 12.9 11/17/2021  ? HGB 14.5 11/05/2021  ? ?Lab Results  ?Component Value Date  ? WBC 10.4 11/18/2021  ? PLT 255 11/18/2021  ? ?No results found for: INR ?Lab Results  ?Component Value Date  ? NA 135 11/18/2021  ? K 3.8 11/18/2021  ? CL 101 11/18/2021  ? CO2 26 11/18/2021  ?  BUN 16 11/18/2021  ? CREATININE 0.54 11/18/2021  ? GLUCOSE 116 (H) 11/18/2021  ? ? ?Discharge Medications:   ?Allergies as of 11/18/2021   ? ?   Reactions  ? Tegretol [carbamazepine] Rash  ? ?  ? ?  ?Medication List  ?  ? ?STOP taking these medications   ? ?acetaminophen 325 MG tablet ?Commonly known as: TYLENOL ?  ?ibuprofen 200 MG tablet ?Commonly known as: ADVIL ?  ? ?  ? ?TAKE these medications   ? ?albuterol 108 (90 Base) MCG/ACT inhaler ?Commonly known as: VENTOLIN HFA ?Inhale 1-2 puffs into the lungs every 4 (four) hours as needed for wheezing or shortness of breath. ?  ?alendronate 70 MG tablet ?Commonly known as: FOSAMAX ?Take 70 mg by mouth every Sunday. ?   ?amLODipine 5 MG tablet ?Commonly known as: NORVASC ?Take 5 mg by mouth at bedtime. ?  ?aspirin EC 81 MG tablet ?Take 81 mg by mouth daily. Swallow whole. ?  ?atorvastatin 40 MG tablet ?Commonly known as: LIPITOR ?Take 40 mg by mouth at bedtime. ?  ?baclofen 10 MG tablet ?Commonly known as: LIORESAL ?Take 10-20 mg by mouth See admin instructions. 10 mg in the morning, 20 mg in the evening ?  ?docusate sodium 100 MG capsule ?Commonly known as: COLACE ?Take 1 capsule (100 mg total) by mouth 2 (two) times daily. ?  ?enoxaparin 40 MG/0.4ML injection ?Commonly known as: LOVENOX ?Inject 0.4 mLs (40 mg total) into the skin daily for 14 days. ?  ?HYDROcodone-acetaminophen 5-325 MG tablet ?Commonly known as: NORCO/VICODIN ?Take 1-2 tablets by mouth every 4 (four) hours as needed for moderate pain (pain score 4-6). ?  ?lamoTRIgine 200 MG tablet ?Commonly known as: LAMICTAL ?Take 200-400 mg by mouth See admin instructions. 200 mg in the morning, 400 mg in the evening ?  ?methocarbamol 500 MG tablet ?Commonly known as: ROBAXIN ?Take 1 tablet (500 mg total) by mouth every 6 (six) hours as needed for muscle spasms. ?  ?Multi-Vitamins Tabs ?Take 1 tablet by mouth daily. ?  ?ondansetron 4 MG tablet ?Commonly known as: ZOFRAN ?Take 1 tablet (4 mg total) by mouth every 6 (six) hours as needed for nausea. ?  ?perindopril 4 MG tablet ?Commonly known as: ACEON ?Take 4 mg by mouth every evening. ?  ?PRESERVISION AREDS 2+MULTI VIT PO ?Take 1 capsule by mouth 2 (two) times daily. ?  ?Systane Balance 0.6 % Soln ?Generic drug: Propylene Glycol ?Place 1 drop into both eyes daily. ?  ? ?  ? ?  ?  ? ? ?  ?Durable Medical Equipment  ?(From admission, onward)  ?  ? ? ?  ? ?  Start     Ordered  ? 11/17/21 1411  DME Walker rolling  Once       ?Question Answer Comment  ?Walker: With 5 Inch Wheels   ?Patient needs a walker to treat with the following condition Status post total hip replacement, left   ?  ? 11/17/21 1410  ? 11/17/21 1411  DME 3 n 1   Once       ? 11/17/21 1410  ? 11/17/21 1411  DME Bedside commode  Once       ?Question:  Patient needs a bedside commode to treat with the following condition  Answer:  Status post total hip replacement, left  ? 11/17/21 1410  ? ?  ?  ? ?  ? ? ?Diagnostic Studies: DG C-Arm 1-60 Min-No Report ? ?Result Date: 11/17/2021 ?Fluoroscopy was  utilized by the requesting physician.  No radiographic interpretation.  ? ?DG C-Arm 1-60 Min-No Report ? ?Result Date: 11/17/2021 ?Fluoroscopy was utilized by the requesting physician.  No radiographic interpretation.  ? ?DG HIP UNILAT WITH PELVIS 1V LEFT ? ?Result Date: 11/17/2021 ?CLINICAL DATA:  Fluoroscopic assistance for left hip arthroplasty EXAM: DG HIP (WITH OR WITHOUT PELVIS) 1V*L* COMPARISON:  05/02/2020 FINDINGS: Fluoroscopic images show left hip arthroplasty. Fluoroscopic time was 10 seconds. Estimated radiation dose is 0.86 mGy. IMPRESSION: Fluoroscopic assistance was provided for left hip arthroplasty. Electronically Signed   By: Elmer Picker M.D.   On: 11/17/2021 12:43  ? ?DG HIP UNILAT W OR W/O PELVIS 2-3 VIEWS LEFT ? ?Result Date: 11/17/2021 ?CLINICAL DATA:  Status post total hip arthroplasty. EXAM: DG HIP (WITH OR WITHOUT PELVIS) 2-3V LEFT COMPARISON:  None Available. FINDINGS: Status post left hip arthroplasty with postsurgical changes as expected. The hardware is intact without evidence of acute complications. IMPRESSION: Status post left hip arthroplasty. Electronically Signed   By: Keane Police D.O.   On: 11/17/2021 12:57   ? ?Disposition: Discharge disposition: 06-Home-Health Care Svc ? ? ? ? ? ? ? ? ? Follow-up Information   ? ? Duanne Guess, PA-C Follow up in 2 week(s).   ?Specialties: Orthopedic Surgery, Emergency Medicine ?Contact information: ?[place] [address] ?[phone] ? ? ?  ?  ? ?  ?  ? ?  ? ? ? ?Signed: ?Dorise Hiss CHRISTOPHER ?11/18/2021, 7:12 AM ? ? ?  ?

## 2021-11-17 NOTE — Op Note (Signed)
11/17/2021 ? ?12:24 PM ? ?PATIENT:  Sarah Cunningham  76 y.o. female ? ?PRE-OPERATIVE DIAGNOSIS:  Bilateral hip osteoarthritis  M87.9 ?Avascular necrosis  M87.00 ? ?POST-OPERATIVE DIAGNOSIS:  avascular necrosis left hip ? ?PROCEDURE:  Procedure(s): ?TOTAL HIP ARTHROPLASTY ANTERIOR APPROACH (Left) ? ?SURGEON: Laurene Footman, MD ? ?ASSISTANTS: None ? ?ANESTHESIA:   spinal ? ?EBL:  Total I/O ?In: 1200 [I.V.:1000; IV Piggyback:200] ?Out: 400 [Urine:350; Blood:50] ? ?BLOOD ADMINISTERED:none ? ?DRAINS:  Incisional wound VAC   ? ?LOCAL MEDICATIONS USED:  MARCAINE    and OTHER Exparel ? ?SPECIMEN:  Source of Specimen:    Left femoral head ? ?DISPOSITION OF SPECIMEN:  PATHOLOGY ? ?COUNTS:  YES ? ?TOURNIQUET:  * No tourniquets in log * ? ?IMPLANTS: Medacta cemented AMIS 2 standard stem with 50 mm Mpact DM cup and liner with metal M 28 mm head ? ?DICTATION: .Dragon Dictation   The patient was brought to the operating room and after spinal anesthesia was obtained patient was placed on the operative table with the ipsilateral foot into the Medacta attachment, contralateral leg on a well-padded table. C-arm was brought in and preop template x-ray taken. After prepping and draping in usual sterile fashion appropriate patient identification and timeout procedures were completed. Anterior approach to the hip was obtained and centered over the greater trochanter and TFL muscle. The subcutaneous tissue was incised hemostasis being achieved by electrocautery. TFL fascia was incised and the muscle retracted laterally deep retractor placed. The lateral femoral circumflex vessels were identified and ligated. The anterior capsule was exposed and a capsulotomy performed. The neck was identified and a femoral neck cut carried out with a saw. The head was removed without difficulty and showed sclerotic femoral head and acetabulum. Reaming was carried out to 50 mm and a 50 mm cup trial gave appropriate tightness to the acetabular component a  50 DM cup was impacted into position. The leg was then externally rotated and ischiofemoral and pubofemoral releases carried out. The femur was sequentially broached to a size 3.  A 3 cement restrictor was put down the canal the canal prepped and then cement inserted with pressurization and a 2 Imus standard stem and then inserted to the appropriate level.  Excess cement removed and sponge placed in the stem protect it from any loose cement after the cemented cement was set the sponges removed and trial was made with an M head and this was chosen for final implant.  The M metal head with 50 mm DM cup were applied to the stem the hip reduced and was stable in appearance and leg lengths appeared appropriate.  The hip was reduced and was stable the wound was thoroughly irrigated with fibrillar placed along the posterior capsule and medial neck. The deep fascia ws closed using a heavy Quill after infiltration of 30 cc of quarter percent Sensorcaine with epinephrine.3-0 V-loc to close the skin with skin staples. Xeroform 4 x 4's ABDs and tape patient was sent to recovery in stable condition.  ? ?PLAN OF CARE: Admit for overnight observation ? ?

## 2021-11-18 ENCOUNTER — Encounter: Payer: Self-pay | Admitting: Orthopedic Surgery

## 2021-11-18 DIAGNOSIS — M87052 Idiopathic aseptic necrosis of left femur: Secondary | ICD-10-CM | POA: Diagnosis not present

## 2021-11-18 LAB — CBC
HCT: 35.5 % — ABNORMAL LOW (ref 36.0–46.0)
Hemoglobin: 11.6 g/dL — ABNORMAL LOW (ref 12.0–15.0)
MCH: 30.9 pg (ref 26.0–34.0)
MCHC: 32.7 g/dL (ref 30.0–36.0)
MCV: 94.4 fL (ref 80.0–100.0)
Platelets: 255 10*3/uL (ref 150–400)
RBC: 3.76 MIL/uL — ABNORMAL LOW (ref 3.87–5.11)
RDW: 13.5 % (ref 11.5–15.5)
WBC: 10.4 10*3/uL (ref 4.0–10.5)
nRBC: 0 % (ref 0.0–0.2)

## 2021-11-18 LAB — BASIC METABOLIC PANEL WITH GFR
Anion gap: 8 (ref 5–15)
BUN: 16 mg/dL (ref 8–23)
CO2: 26 mmol/L (ref 22–32)
Calcium: 9.2 mg/dL (ref 8.9–10.3)
Chloride: 101 mmol/L (ref 98–111)
Creatinine, Ser: 0.54 mg/dL (ref 0.44–1.00)
GFR, Estimated: >60 mL/min
Glucose, Bld: 116 mg/dL — ABNORMAL HIGH (ref 70–99)
Potassium: 3.8 mmol/L (ref 3.5–5.1)
Sodium: 135 mmol/L (ref 135–145)

## 2021-11-18 NOTE — Progress Notes (Signed)
? ?  Subjective: ?1 Day Post-Op Procedure(s) (LRB): ?TOTAL HIP ARTHROPLASTY ANTERIOR APPROACH (Left) ?Patient reports pain as mild.   ?Patient is well, and has had no acute complaints or problems ?Denies any CP, SOB, ABD pain. ?We will continue therapy today.  ?Plan is to go Home after hospital stay. ? ?Objective: ?Vital signs in last 24 hours: ?Temp:  [97.4 ?F (36.3 ?C)-98.2 ?F (36.8 ?C)] 97.8 ?F (36.6 ?C) (05/17 0354) ?Pulse Rate:  [58-120] 80 (05/17 0623) ?Resp:  [10-25] 18 (05/17 6568) ?BP: (111-151)/(71-100) 120/76 (05/17 1275) ?SpO2:  [93 %-100 %] 95 % (05/17 1700) ?Weight:  [50.3 kg] 50.3 kg (05/16 0758) ? ?Intake/Output from previous day: ?05/16 0701 - 05/17 0700 ?In: 1749.5 [P.O.:240; I.V.:1309.5; IV Piggyback:200] ?Out: 1600 [Urine:1550; Blood:50] ?Intake/Output this shift: ?No intake/output data recorded. ? ?Recent Labs  ?  11/17/21 ?1431 11/18/21 ?0256  ?HGB 12.9 11.6*  ? ?Recent Labs  ?  11/17/21 ?1431 11/18/21 ?0256  ?WBC 11.5* 10.4  ?RBC 4.14 3.76*  ?HCT 39.5 35.5*  ?PLT 263 255  ? ?Recent Labs  ?  11/17/21 ?1431 11/18/21 ?0256  ?NA  --  135  ?K  --  3.8  ?CL  --  101  ?CO2  --  26  ?BUN  --  16  ?CREATININE 0.55 0.54  ?GLUCOSE  --  116*  ?CALCIUM  --  9.2  ? ?No results for input(s): LABPT, INR in the last 72 hours. ? ?EXAM ?General - Patient is Alert, Appropriate, and Oriented ?Extremity - Neurovascular intact ?Sensation intact distally ?Intact pulses distally ?Dorsiflexion/Plantar flexion intact ?Dressing - dressing C/D/I and no drainage, provena intact with out drainage ?Motor Function - intact, moving foot and toes well on exam.  ? ?Past Medical History:  ?Diagnosis Date  ? Arthritis 2013  ? rt hip  ? HLD (hyperlipidemia)   ? Hypertension   ? Tobacco dependence   ? Trigeminal neuralgia   ? ? ?Assessment/Plan:   ?1 Day Post-Op Procedure(s) (LRB): ?TOTAL HIP ARTHROPLASTY ANTERIOR APPROACH (Left) ?Principal Problem: ?  Status post total hip replacement, left ? ?Estimated body mass index is 17.92  kg/m? as calculated from the following: ?  Height as of this encounter: '5\' 6"'$  (1.676 m). ?  Weight as of this encounter: 50.3 kg. ?Advance diet ?Up with therapy ?Pain controlled ?VSS ?Labs stable ?CM to assist with discharge to home with HHPT today ? ?DVT Prophylaxis - Lovenox, TED hose, and SCDs ?Weight-Bearing as tolerated to left leg ? ? ?T. Rachelle Hora, PA-C ?[place] ?11/18/2021, 7:10 AM ?  ?

## 2021-11-18 NOTE — Progress Notes (Signed)
Pt discharged home via [facility]. ?

## 2021-11-19 LAB — SURGICAL PATHOLOGY

## 2021-11-23 ENCOUNTER — Encounter: Payer: Self-pay | Admitting: Orthopedic Surgery

## 2021-11-23 NOTE — Anesthesia Postprocedure Evaluation (Signed)
Anesthesia Post Note  Patient: Sarah Cunningham  Procedure(s) Performed: TOTAL HIP ARTHROPLASTY ANTERIOR APPROACH (Left: Hip)  Anesthesia Type: Spinal Pain management: pain level controlled Vital Signs Assessment: post-procedure vital signs reviewed and stable Respiratory status: respiratory function stable Cardiovascular status: blood pressure returned to baseline and stable Postop Assessment: no headache, no backache and able to ambulate Anesthetic complications: no Comments: Patient was discharged prior to being seen by the post op anesthesia care team.  Per nursing notes the patient was doing well, ambulating, and had no complaints.   No notable events documented.   Last Vitals:  Vitals:   11/18/21 0623 11/18/21 0726  BP: 120/76 108/68  Pulse: 80 73  Resp: 18 19  Temp: 36.6 C 36.8 C  SpO2: 95% 94%    Last Pain:  Vitals:   11/18/21 0633  TempSrc:   PainSc: 2                  Martha Clan

## 2021-12-30 DIAGNOSIS — Z96642 Presence of left artificial hip joint: Secondary | ICD-10-CM | POA: Diagnosis not present

## 2022-01-12 DIAGNOSIS — G500 Trigeminal neuralgia: Secondary | ICD-10-CM | POA: Diagnosis not present

## 2022-01-15 ENCOUNTER — Other Ambulatory Visit: Payer: Self-pay | Admitting: Orthopedic Surgery

## 2022-02-04 ENCOUNTER — Encounter
Admission: RE | Admit: 2022-02-04 | Discharge: 2022-02-04 | Disposition: A | Discharge status: Home / Self Care | Payer: PPO | Source: Ambulatory Visit | Attending: Orthopedic Surgery | Admitting: Orthopedic Surgery

## 2022-02-04 VITALS — BP 134/94 | HR 82 | Temp 98.2°F | Resp 16 | Ht 66.5 in | Wt 112.0 lb

## 2022-02-04 DIAGNOSIS — Z01812 Encounter for preprocedural laboratory examination: Secondary | ICD-10-CM

## 2022-02-04 DIAGNOSIS — Z01818 Encounter for other preprocedural examination: Secondary | ICD-10-CM | POA: Diagnosis not present

## 2022-02-04 HISTORY — DX: Dyspnea, unspecified: R06.00

## 2022-02-04 LAB — CBC WITH DIFFERENTIAL/PLATELET
Abs Immature Granulocytes: 0.03 10*3/uL (ref 0.00–0.07)
Basophils Absolute: 0 10*3/uL (ref 0.0–0.1)
Basophils Relative: 1 %
Eosinophils Absolute: 0.1 10*3/uL (ref 0.0–0.5)
Eosinophils Relative: 2 %
HCT: 42.8 % (ref 36.0–46.0)
Hemoglobin: 14.2 g/dL (ref 12.0–15.0)
Immature Granulocytes: 0 %
Lymphocytes Relative: 19 %
Lymphs Abs: 1.7 10*3/uL (ref 0.7–4.0)
MCH: 32.5 pg (ref 26.0–34.0)
MCHC: 33.2 g/dL (ref 30.0–36.0)
MCV: 97.9 fL (ref 80.0–100.0)
Monocytes Absolute: 0.7 10*3/uL (ref 0.1–1.0)
Monocytes Relative: 8 %
Neutro Abs: 6.3 10*3/uL (ref 1.7–7.7)
Neutrophils Relative %: 70 %
Platelets: 305 10*3/uL (ref 150–400)
RBC: 4.37 MIL/uL (ref 3.87–5.11)
RDW: 13.7 % (ref 11.5–15.5)
WBC: 8.8 10*3/uL (ref 4.0–10.5)
nRBC: 0 % (ref 0.0–0.2)

## 2022-02-04 LAB — TYPE AND SCREEN
ABO/RH(D): A POS
Antibody Screen: NEGATIVE

## 2022-02-04 LAB — COMPREHENSIVE METABOLIC PANEL
ALT: 28 U/L (ref 0–44)
AST: 32 U/L (ref 15–41)
Albumin: 4.6 g/dL (ref 3.5–5.0)
Alkaline Phosphatase: 135 U/L — ABNORMAL HIGH (ref 38–126)
Anion gap: 10 (ref 5–15)
BUN: 26 mg/dL — ABNORMAL HIGH (ref 8–23)
CO2: 25 mmol/L (ref 22–32)
Calcium: 10.8 mg/dL — ABNORMAL HIGH (ref 8.9–10.3)
Chloride: 105 mmol/L (ref 98–111)
Creatinine, Ser: 0.65 mg/dL (ref 0.44–1.00)
GFR, Estimated: >60 mL/min (ref >60)
Glucose, Bld: 104 mg/dL — ABNORMAL HIGH (ref 70–99)
Potassium: 3.5 mmol/L (ref 3.5–5.1)
Sodium: 140 mmol/L (ref 135–145)
Total Bilirubin: 0.7 mg/dL (ref 0.3–1.2)
Total Protein: 7.3 g/dL (ref 6.5–8.1)

## 2022-02-04 LAB — URINALYSIS, COMPLETE (UACMP) WITH MICROSCOPIC
Bilirubin Urine: NEGATIVE
Glucose, UA: NEGATIVE mg/dL
Hgb urine dipstick: NEGATIVE
Ketones, ur: NEGATIVE mg/dL
Nitrite: NEGATIVE
Protein, ur: NEGATIVE mg/dL
Specific Gravity, Urine: 1.009 (ref 1.005–1.030)
Squamous Epithelial / HPF: NONE SEEN (ref 0–5)
pH: 7 (ref 5.0–8.0)

## 2022-02-04 LAB — SURGICAL PCR SCREEN
MRSA, PCR: NEGATIVE
Staphylococcus aureus: NEGATIVE

## 2022-02-04 NOTE — Patient Instructions (Addendum)
Your procedure is scheduled on: Tuesday February 16, 2022. Report to Day Surgery inside [facility] 2nd floor, stop by admissions desk before getting on elevator.  To find out your arrival time please call [phone] between 1PM - 3PM on Monday February 15, 2022.  Remember: Instructions that are not followed completely may result in serious medical risk,  up to and including death, or upon the discretion of your surgeon and anesthesiologist your  surgery may need to be rescheduled.     _X__ 1. Do not eat food after midnight the night before your procedure.                 No chewing gum or hard candies. You may drink clear liquids up to 2 hours                 before you are scheduled to arrive for your surgery- DO not drink clear                 liquids within 2 hours of the start of your surgery.                 Clear Liquids include:  water, apple juice without pulp, clear Gatorade, G2 or                  Gatorade Zero (avoid Red/Purple/Blue), Black Coffee or Tea (Do not add                 anything to coffee or tea).  __X__2.   Complete the "Ensure Clear Pre-surgery Clear Carbohydrate Drink" provided to you, 2 hours before arrival. **If you are diabetic you will be provided with an alternative drink, Gatorade Zero or G2.  __X__3.  On the morning of surgery brush your teeth with toothpaste and water, you                may rinse your mouth with mouthwash if you wish.  Do not swallow any toothpaste of mouthwash.     _X__ 4.  No Alcohol for 24 hours before or after surgery.   _X__ 5.  Do Not Smoke or use e-cigarettes For 24 Hours Prior to Your Surgery.                 Do not use any chewable tobacco products for at least 6 hours prior to                 Surgery.  _X__  6.  Do not use any recreational drugs (marijuana, cocaine, heroin, ecstasy, MDMA or other)                For at least one week prior to your surgery.  Combination of these drugs with anesthesia                 May have life threatening results.  ____  7.  Bring all medications with you on the day of surgery if instructed.   __X_ 8.  Notify your doctor if there is any change in your medical condition      (cold, fever, infections).     Do not wear jewelry, make-up, hairpins, clips or nail polish. Do not wear lotions, powders, or perfumes. You may wear deodorant. Do not shave 48 hours prior to surgery. Men may shave face and neck. Do not bring valuables to the hospital.    [facility] is not responsible for any belongings or valuables.  Contacts, dentures or bridgework may not be worn into surgery. Leave your suitcase in the car. After surgery it may be brought to your room. For patients admitted to the hospital, discharge time is determined by your treatment team.   Patients discharged the day of surgery will not be allowed to drive home.   Make arrangements for someone to be with you for the first 24 hours of your Same Day Discharge.   __X__ Take these medicines the morning of surgery with A SIP OF WATER:    1. lamoTRIgine (LAMICTAL) 200 MG tablet  2. baclofen (LIORESAL) 10 MG tablet  3.   4.  5.  6.  ____ Fleet Enema (as directed)   __X__ Use CHG Soap (or wipes) as directed  ____ Use Benzoyl Peroxide Gel as instructed  __X__ Use inhalers on the day of surgery  albuterol (PROVENTIL HFA;VENTOLIN HFA) 108 (90 Base) MCG/ACT inhaler  ____ Stop metformin 2 days prior to surgery    ____ Take 1/2 of usual insulin dose the night before surgery. No insulin the morning          of surgery.   __X__ Stop aspirin 81 mg 1 week prior to surgery (last dose will be 02/08/22).   __X__ One Week prior to surgery- Stop Anti-inflammatories such as Ibuprofen, Aleve, Advil, Motrin, meloxicam (MOBIC), diclofenac, etodolac, ketorolac, Toradol, Daypro, piroxicam, Goody's or BC powders. OK TO USE TYLENOL IF NEEDED   __X__ Stop supplements until after surgery.    ____ Bring C-Pap to the  hospital.    If you have any questions regarding your pre-procedure instructions,  Please call Pre-admit Testing at [phone]

## 2022-02-15 MED ORDER — CHLORHEXIDINE GLUCONATE 0.12 % MT SOLN: 15.0000 mL | Freq: Once | OROMUCOSAL | Status: AC

## 2022-02-15 MED ORDER — ORAL CARE MOUTH RINSE: 15.0000 mL | Freq: Once | OROMUCOSAL | Status: AC

## 2022-02-15 MED ORDER — LACTATED RINGERS IV SOLN: INTRAVENOUS | Status: DC

## 2022-02-15 MED ORDER — FAMOTIDINE 20 MG PO TABS: 20.0000 mg | ORAL_TABLET | Freq: Once | ORAL | Status: AC

## 2022-02-15 MED ORDER — CEFAZOLIN SODIUM-DEXTROSE 2-4 GM/100ML-% IV SOLN
2.0000 g | INTRAVENOUS | Status: AC
  Administered 2022-02-16: 2 g via INTRAVENOUS

## 2022-02-16 ENCOUNTER — Observation Stay
Admission: RE | Admit: 2022-02-16 | Discharge: 2022-02-17 | Disposition: A | Discharge status: Home Health | Payer: PPO | Attending: Orthopedic Surgery | Admitting: Orthopedic Surgery

## 2022-02-16 ENCOUNTER — Ambulatory Visit: Payer: PPO | Admitting: Urgent Care

## 2022-02-16 ENCOUNTER — Other Ambulatory Visit: Payer: Self-pay

## 2022-02-16 ENCOUNTER — Encounter: Payer: Self-pay | Admitting: Orthopedic Surgery

## 2022-02-16 ENCOUNTER — Observation Stay: Payer: PPO

## 2022-02-16 ENCOUNTER — Encounter
Admission: RE | Disposition: A | Discharge status: Home Health | Payer: Self-pay | Source: Home / Self Care | Attending: Orthopedic Surgery

## 2022-02-16 ENCOUNTER — Ambulatory Visit: Payer: PPO

## 2022-02-16 DIAGNOSIS — Z7982 Long term (current) use of aspirin: Secondary | ICD-10-CM | POA: Insufficient documentation

## 2022-02-16 DIAGNOSIS — Z96641 Presence of right artificial hip joint: Secondary | ICD-10-CM | POA: Diagnosis not present

## 2022-02-16 DIAGNOSIS — M1612 Unilateral primary osteoarthritis, left hip: Secondary | ICD-10-CM | POA: Diagnosis not present

## 2022-02-16 DIAGNOSIS — M879 Osteonecrosis, unspecified: Secondary | ICD-10-CM | POA: Diagnosis not present

## 2022-02-16 DIAGNOSIS — Z471 Aftercare following joint replacement surgery: Secondary | ICD-10-CM | POA: Diagnosis not present

## 2022-02-16 DIAGNOSIS — M87051 Idiopathic aseptic necrosis of right femur: Secondary | ICD-10-CM | POA: Diagnosis not present

## 2022-02-16 DIAGNOSIS — Z79899 Other long term (current) drug therapy: Secondary | ICD-10-CM | POA: Diagnosis not present

## 2022-02-16 DIAGNOSIS — I10 Essential (primary) hypertension: Secondary | ICD-10-CM | POA: Insufficient documentation

## 2022-02-16 DIAGNOSIS — Z96642 Presence of left artificial hip joint: Secondary | ICD-10-CM | POA: Insufficient documentation

## 2022-02-16 HISTORY — PX: TOTAL HIP ARTHROPLASTY: SHX124

## 2022-02-16 LAB — CBC
HCT: 36.9 % (ref 36.0–46.0)
Hemoglobin: 12.2 g/dL (ref 12.0–15.0)
MCH: 32.4 pg (ref 26.0–34.0)
MCHC: 33.1 g/dL (ref 30.0–36.0)
MCV: 97.9 fL (ref 80.0–100.0)
Platelets: 228 10*3/uL (ref 150–400)
RBC: 3.77 MIL/uL — ABNORMAL LOW (ref 3.87–5.11)
RDW: 13.8 % (ref 11.5–15.5)
WBC: 16 10*3/uL — ABNORMAL HIGH (ref 4.0–10.5)
nRBC: 0 % (ref 0.0–0.2)

## 2022-02-16 LAB — CREATININE, SERUM
Creatinine, Ser: 0.55 mg/dL (ref 0.44–1.00)
GFR, Estimated: >60 mL/min (ref >60)

## 2022-02-16 SURGERY — ARTHROPLASTY, HIP, TOTAL, ANTERIOR APPROACH
Anesthesia: Spinal | Site: Hip | Laterality: Right

## 2022-02-16 MED ORDER — ACETAMINOPHEN 10 MG/ML IV SOLN: 1000.0000 mg | Freq: Once | INTRAVENOUS | Status: DC | PRN

## 2022-02-16 MED ORDER — BUPIVACAINE HCL (PF) 0.5 % IJ SOLN
INTRAMUSCULAR | Status: AC
  Filled 2022-02-16: qty 10

## 2022-02-16 MED ORDER — BACLOFEN 10 MG PO TABS: 10.0000 mg | ORAL_TABLET | ORAL | Status: DC

## 2022-02-16 MED ORDER — HYDROCODONE-ACETAMINOPHEN 5-325 MG PO TABS: 1.0000 | ORAL_TABLET | ORAL | 0 refills | Status: DC | PRN

## 2022-02-16 MED ORDER — LACTATED RINGERS IV BOLUS
250.0000 mL | Freq: Once | INTRAVENOUS | Status: AC
  Administered 2022-02-17: 250 mL via INTRAVENOUS

## 2022-02-16 MED ORDER — CHLORHEXIDINE GLUCONATE 0.12 % MT SOLN
OROMUCOSAL | Status: AC
  Administered 2022-02-16: 15 mL via OROMUCOSAL
  Filled 2022-02-16: qty 15

## 2022-02-16 MED ORDER — BISACODYL 10 MG RE SUPP: 10.0000 mg | Freq: Every day | RECTAL | Status: DC | PRN

## 2022-02-16 MED ORDER — GLYCOPYRROLATE 0.2 MG/ML IJ SOLN
INTRAMUSCULAR | Status: AC
  Filled 2022-02-16: qty 1

## 2022-02-16 MED ORDER — ONDANSETRON HCL 4 MG/2ML IJ SOLN: 4.0000 mg | Freq: Four times a day (QID) | INTRAMUSCULAR | Status: DC | PRN

## 2022-02-16 MED ORDER — 0.9 % SODIUM CHLORIDE (POUR BTL) OPTIME
TOPICAL | Status: DC | PRN
  Administered 2022-02-16: 1000 mL

## 2022-02-16 MED ORDER — ADULT MULTIVITAMIN W/MINERALS CH
1.0000 | ORAL_TABLET | Freq: Every day | ORAL | Status: DC
  Administered 2022-02-16 – 2022-02-17 (×2): 1 via ORAL
  Filled 2022-02-16 (×2): qty 1

## 2022-02-16 MED ORDER — METOCLOPRAMIDE HCL 5 MG PO TABS: 5.0000 mg | ORAL_TABLET | Freq: Three times a day (TID) | ORAL | Status: DC | PRN

## 2022-02-16 MED ORDER — SODIUM CHLORIDE 0.9 % IV SOLN: INTRAVENOUS | Status: DC

## 2022-02-16 MED ORDER — FENTANYL CITRATE (PF) 100 MCG/2ML IJ SOLN: 25.0000 ug | INTRAMUSCULAR | Status: DC | PRN

## 2022-02-16 MED ORDER — PROPOFOL 1000 MG/100ML IV EMUL
INTRAVENOUS | Status: AC
  Filled 2022-02-16: qty 100

## 2022-02-16 MED ORDER — DIPHENHYDRAMINE HCL 12.5 MG/5ML PO ELIX: 12.5000 mg | ORAL_SOLUTION | ORAL | Status: DC | PRN

## 2022-02-16 MED ORDER — ASPIRIN 81 MG PO TBEC
81.0000 mg | DELAYED_RELEASE_TABLET | Freq: Every day | ORAL | Status: DC
  Administered 2022-02-16 – 2022-02-17 (×2): 81 mg via ORAL
  Filled 2022-02-16 (×2): qty 1

## 2022-02-16 MED ORDER — BACLOFEN 10 MG PO TABS
10.0000 mg | ORAL_TABLET | Freq: Every morning | ORAL | Status: DC
  Administered 2022-02-17: 10 mg via ORAL
  Filled 2022-02-16: qty 1

## 2022-02-16 MED ORDER — ENOXAPARIN SODIUM 40 MG/0.4ML IJ SOSY: 40.0000 mg | PREFILLED_SYRINGE | INTRAMUSCULAR | 0 refills | Status: DC

## 2022-02-16 MED ORDER — MORPHINE SULFATE (PF) 2 MG/ML IV SOLN: 0.5000 mg | INTRAVENOUS | Status: DC | PRN

## 2022-02-16 MED ORDER — FLEET ENEMA 7-19 GM/118ML RE ENEM: 1.0000 | ENEMA | Freq: Once | RECTAL | Status: DC | PRN

## 2022-02-16 MED ORDER — BUPIVACAINE HCL (PF) 0.5 % IJ SOLN
INTRAMUSCULAR | Status: DC | PRN
  Administered 2022-02-16: 2 mL via INTRATHECAL

## 2022-02-16 MED ORDER — METHOCARBAMOL 1000 MG/10ML IJ SOLN: 500.0000 mg | Freq: Four times a day (QID) | INTRAVENOUS | Status: DC | PRN

## 2022-02-16 MED ORDER — LACTATED RINGERS IV BOLUS
500.0000 mL | Freq: Once | INTRAVENOUS | Status: AC
  Administered 2022-02-16: 500 mL via INTRAVENOUS

## 2022-02-16 MED ORDER — ONDANSETRON HCL 4 MG PO TABS: 4.0000 mg | ORAL_TABLET | Freq: Every day | ORAL | 1 refills | Status: AC | PRN

## 2022-02-16 MED ORDER — METOCLOPRAMIDE HCL 5 MG/ML IJ SOLN: 5.0000 mg | Freq: Three times a day (TID) | INTRAMUSCULAR | Status: DC | PRN

## 2022-02-16 MED ORDER — DOCUSATE SODIUM 100 MG PO CAPS: 100.0000 mg | ORAL_CAPSULE | Freq: Two times a day (BID) | ORAL | Status: DC

## 2022-02-16 MED ORDER — ACETAMINOPHEN 10 MG/ML IV SOLN
INTRAVENOUS | Status: DC | PRN
  Administered 2022-02-16: 1000 mg via INTRAVENOUS

## 2022-02-16 MED ORDER — POLYVINYL ALCOHOL 1.4 % OP SOLN
1.0000 [drp] | Freq: Every day | OPHTHALMIC | Status: DC
Start: 2022-02-16 — End: 2022-02-17
  Administered 2022-02-17: 1 [drp] via OPHTHALMIC
  Filled 2022-02-16: qty 15

## 2022-02-16 MED ORDER — PROPOFOL 10 MG/ML IV BOLUS
INTRAVENOUS | Status: DC | PRN
  Administered 2022-02-16: 20 mg via INTRAVENOUS
  Administered 2022-02-16: 15 mg via INTRAVENOUS
  Administered 2022-02-16: 25 mg via INTRAVENOUS

## 2022-02-16 MED ORDER — ATORVASTATIN CALCIUM 20 MG PO TABS
40.0000 mg | ORAL_TABLET | Freq: Every day | ORAL | Status: DC
  Administered 2022-02-16 – 2022-02-17 (×2): 40 mg via ORAL
  Filled 2022-02-16 (×2): qty 2

## 2022-02-16 MED ORDER — OXYCODONE HCL 5 MG/5ML PO SOLN: 5.0000 mg | Freq: Once | ORAL | Status: DC | PRN

## 2022-02-16 MED ORDER — OCUVITE-LUTEIN PO CAPS
1.0000 | ORAL_CAPSULE | Freq: Every day | ORAL | Status: DC
  Administered 2022-02-17: 1 via ORAL
  Filled 2022-02-16: qty 1

## 2022-02-16 MED ORDER — ALBUTEROL SULFATE HFA 108 (90 BASE) MCG/ACT IN AERS: 1.0000 | INHALATION_SPRAY | RESPIRATORY_TRACT | Status: DC | PRN

## 2022-02-16 MED ORDER — OXYCODONE HCL 5 MG PO TABS: 5.0000 mg | ORAL_TABLET | Freq: Once | ORAL | Status: DC | PRN

## 2022-02-16 MED ORDER — CEFAZOLIN SODIUM-DEXTROSE 1-4 GM/50ML-% IV SOLN
1.0000 g | Freq: Four times a day (QID) | INTRAVENOUS | Status: AC
  Administered 2022-02-16 – 2022-02-17 (×2): 1 g via INTRAVENOUS
  Filled 2022-02-16 (×2): qty 50

## 2022-02-16 MED ORDER — PHENYLEPHRINE HCL (PRESSORS) 10 MG/ML IV SOLN
INTRAVENOUS | Status: DC | PRN
  Administered 2022-02-16: 80 ug via INTRAVENOUS

## 2022-02-16 MED ORDER — METHOCARBAMOL 500 MG PO TABS: 500.0000 mg | ORAL_TABLET | Freq: Four times a day (QID) | ORAL | 2 refills | Status: DC | PRN

## 2022-02-16 MED ORDER — AMLODIPINE BESYLATE 5 MG PO TABS
5.0000 mg | ORAL_TABLET | Freq: Every day | ORAL | Status: DC
  Administered 2022-02-16 – 2022-02-17 (×2): 5 mg via ORAL
  Filled 2022-02-16 (×2): qty 1

## 2022-02-16 MED ORDER — FENTANYL CITRATE (PF) 100 MCG/2ML IJ SOLN
INTRAMUSCULAR | Status: DC | PRN
  Administered 2022-02-16 (×4): 25 ug via INTRAVENOUS

## 2022-02-16 MED ORDER — GLYCOPYRROLATE 0.2 MG/ML IJ SOLN
INTRAMUSCULAR | Status: DC | PRN
  Administered 2022-02-16: .2 mg via INTRAVENOUS

## 2022-02-16 MED ORDER — ACETAMINOPHEN 10 MG/ML IV SOLN
INTRAVENOUS | Status: AC
  Filled 2022-02-16: qty 100

## 2022-02-16 MED ORDER — METHOCARBAMOL 500 MG PO TABS: 500.0000 mg | ORAL_TABLET | Freq: Four times a day (QID) | ORAL | Status: DC | PRN

## 2022-02-16 MED ORDER — ONDANSETRON HCL 4 MG/2ML IJ SOLN
INTRAMUSCULAR | Status: AC
  Filled 2022-02-16: qty 2

## 2022-02-16 MED ORDER — DEXAMETHASONE SODIUM PHOSPHATE 10 MG/ML IJ SOLN
INTRAMUSCULAR | Status: DC | PRN
  Administered 2022-02-16: 10 mg via INTRAVENOUS

## 2022-02-16 MED ORDER — ONDANSETRON HCL 4 MG PO TABS: 4.0000 mg | ORAL_TABLET | Freq: Four times a day (QID) | ORAL | Status: DC | PRN

## 2022-02-16 MED ORDER — DEXAMETHASONE SODIUM PHOSPHATE 10 MG/ML IJ SOLN
INTRAMUSCULAR | Status: AC
  Filled 2022-02-16: qty 1

## 2022-02-16 MED ORDER — LAMOTRIGINE 25 MG PO TABS
200.0000 mg | ORAL_TABLET | Freq: Two times a day (BID) | ORAL | Status: DC
  Administered 2022-02-17: 200 mg via ORAL
  Filled 2022-02-16 (×2): qty 8

## 2022-02-16 MED ORDER — SURGIRINSE WOUND IRRIGATION SYSTEM - OPTIME
TOPICAL | Status: DC | PRN
  Administered 2022-02-16: 900 mL via TOPICAL

## 2022-02-16 MED ORDER — FAMOTIDINE 20 MG PO TABS
ORAL_TABLET | ORAL | Status: AC
  Administered 2022-02-16: 20 mg via ORAL
  Filled 2022-02-16: qty 1

## 2022-02-16 MED ORDER — ONDANSETRON HCL 4 MG/2ML IJ SOLN
INTRAMUSCULAR | Status: DC | PRN
  Administered 2022-02-16: 4 mg via INTRAVENOUS

## 2022-02-16 MED ORDER — HYDROCODONE-ACETAMINOPHEN 5-325 MG PO TABS
1.0000 | ORAL_TABLET | ORAL | Status: DC | PRN
  Administered 2022-02-16 – 2022-02-17 (×2): 1 via ORAL
  Filled 2022-02-16 (×2): qty 1

## 2022-02-16 MED ORDER — ACETAMINOPHEN 325 MG PO TABS
650.0000 mg | ORAL_TABLET | ORAL | Status: DC | PRN
Start: 2022-02-16 — End: 2022-02-17
  Administered 2022-02-16 – 2022-02-17 (×2): 650 mg via ORAL
  Filled 2022-02-16 (×3): qty 2

## 2022-02-16 MED ORDER — HYDROCODONE-ACETAMINOPHEN 7.5-325 MG PO TABS: 1.0000 | ORAL_TABLET | ORAL | Status: DC | PRN

## 2022-02-16 MED ORDER — ENOXAPARIN SODIUM 40 MG/0.4ML IJ SOSY
40.0000 mg | PREFILLED_SYRINGE | INTRAMUSCULAR | Status: DC
  Administered 2022-02-17: 40 mg via SUBCUTANEOUS
  Filled 2022-02-16: qty 0.4

## 2022-02-16 MED ORDER — ZOLPIDEM TARTRATE 5 MG PO TABS: 5.0000 mg | ORAL_TABLET | Freq: Every evening | ORAL | Status: DC | PRN

## 2022-02-16 MED ORDER — BUPIVACAINE LIPOSOME 1.3 % IJ SUSP
INTRAMUSCULAR | Status: AC
  Filled 2022-02-16: qty 20

## 2022-02-16 MED ORDER — CEFAZOLIN SODIUM-DEXTROSE 2-4 GM/100ML-% IV SOLN
INTRAVENOUS | Status: AC
  Filled 2022-02-16: qty 100

## 2022-02-16 MED ORDER — TRAMADOL HCL 50 MG PO TABS
50.0000 mg | ORAL_TABLET | Freq: Four times a day (QID) | ORAL | Status: DC
  Administered 2022-02-16 – 2022-02-17 (×4): 50 mg via ORAL
  Filled 2022-02-16 (×3): qty 1

## 2022-02-16 MED ORDER — ACETAMINOPHEN 325 MG PO TABS: 325.0000 mg | ORAL_TABLET | Freq: Four times a day (QID) | ORAL | Status: DC | PRN

## 2022-02-16 MED ORDER — MENTHOL 3 MG MT LOZG: 1.0000 | LOZENGE | OROMUCOSAL | Status: DC | PRN

## 2022-02-16 MED ORDER — FENTANYL CITRATE (PF) 100 MCG/2ML IJ SOLN
INTRAMUSCULAR | Status: AC
  Filled 2022-02-16: qty 2

## 2022-02-16 MED ORDER — BACLOFEN 10 MG PO TABS
20.0000 mg | ORAL_TABLET | Freq: Every evening | ORAL | Status: DC
  Administered 2022-02-16: 20 mg via ORAL
  Filled 2022-02-16: qty 2

## 2022-02-16 MED ORDER — SODIUM CHLORIDE (PF) 0.9 % IJ SOLN
INTRAMUSCULAR | Status: DC | PRN
  Administered 2022-02-16: 90 mL

## 2022-02-16 MED ORDER — PROPOFOL 500 MG/50ML IV EMUL
INTRAVENOUS | Status: DC | PRN
  Administered 2022-02-16: 75 ug/kg/min via INTRAVENOUS

## 2022-02-16 MED ORDER — PHENOL 1.4 % MT LIQD
1.0000 | OROMUCOSAL | Status: DC | PRN
Start: 2022-02-16 — End: 2022-02-17

## 2022-02-16 MED ORDER — SENNOSIDES-DOCUSATE SODIUM 8.6-50 MG PO TABS: 1.0000 | ORAL_TABLET | Freq: Every evening | ORAL | Status: DC | PRN

## 2022-02-16 MED ORDER — ALUM & MAG HYDROXIDE-SIMETH 200-200-20 MG/5ML PO SUSP: 30.0000 mL | ORAL | Status: DC | PRN

## 2022-02-16 MED ORDER — ONDANSETRON HCL 4 MG/2ML IJ SOLN: 4.0000 mg | Freq: Once | INTRAMUSCULAR | Status: DC | PRN

## 2022-02-16 MED ORDER — TRAMADOL HCL 50 MG PO TABS
ORAL_TABLET | ORAL | Status: AC
  Filled 2022-02-16: qty 1

## 2022-02-16 MED ORDER — DOCUSATE SODIUM 100 MG PO CAPS
100.0000 mg | ORAL_CAPSULE | Freq: Two times a day (BID) | ORAL | Status: DC
  Administered 2022-02-16 – 2022-02-17 (×2): 100 mg via ORAL
  Filled 2022-02-16 (×2): qty 1

## 2022-02-16 MED ORDER — SODIUM CHLORIDE FLUSH 0.9 % IV SOLN
INTRAVENOUS | Status: AC
  Filled 2022-02-16: qty 40

## 2022-02-16 SURGICAL SUPPLY — 53 items
BLADE SAGITTAL AGGR TOOTH XLG (BLADE) ×2 IMPLANT
BNDG COHESIVE 6X5 TAN ST LF (GAUZE/BANDAGES/DRESSINGS) ×6 IMPLANT
CANISTER WOUND CARE 500ML ATS (WOUND CARE) ×2 IMPLANT
CEMENT BONE 40GM (Cement) ×2 IMPLANT
CEMENT RESTRICTOR DEPUY SZ 3 (Cement) ×1 IMPLANT
CHLORAPREP W/TINT 26 (MISCELLANEOUS) ×2 IMPLANT
COVER BACK TABLE REUSABLE LG (DRAPES) ×2 IMPLANT
DRAPE 3/4 80X56 (DRAPES) ×6 IMPLANT
DRAPE C-ARM XRAY 36X54 (DRAPES) ×2 IMPLANT
DRAPE POUCH INSTRU U-SHP 10X18 (DRAPES) ×2 IMPLANT
DRESSING SURGICEL FIBRLLR 1X2 (HEMOSTASIS) ×2 IMPLANT
DRSG MEPILEX SACRM 8.7X9.8 (GAUZE/BANDAGES/DRESSINGS) ×2 IMPLANT
DRSG SURGICEL FIBRILLAR 1X2 (HEMOSTASIS) ×4
ELECT BLADE 6.5 EXT (BLADE) ×2 IMPLANT
ELECT REM PT RETURN 9FT ADLT (ELECTROSURGICAL) ×2
ELECTRODE REM PT RTRN 9FT ADLT (ELECTROSURGICAL) ×1 IMPLANT
GLOVE BIOGEL PI IND STRL 9 (GLOVE) ×1 IMPLANT
GLOVE BIOGEL PI INDICATOR 9 (GLOVE) ×2
GLOVE SURG SYN 9.0  PF PI (GLOVE) ×4
GLOVE SURG SYN 9.0 PF PI (GLOVE) ×2 IMPLANT
GOWN SRG 2XL LVL 4 RGLN SLV (GOWNS) ×1 IMPLANT
GOWN STRL NON-REIN 2XL LVL4 (GOWNS) ×2
GOWN STRL REUS W/ TWL LRG LVL3 (GOWN DISPOSABLE) ×1 IMPLANT
GOWN STRL REUS W/TWL LRG LVL3 (GOWN DISPOSABLE) ×2
HIP FEM HD S 28 (Head) ×1 IMPLANT
HIP STEM FEM 2 STD (Stem) ×1 IMPLANT
HOLDER FOLEY CATH W/STRAP (MISCELLANEOUS) ×2 IMPLANT
HOOD PEEL AWAY FLYTE STAYCOOL (MISCELLANEOUS) ×2 IMPLANT
KIT PREVENA INCISION MGT 13 (CANNISTER) ×2 IMPLANT
LINER DBL MOB SZ 0 52MM (Liner) ×1 IMPLANT
MANIFOLD NEPTUNE II (INSTRUMENTS) ×2 IMPLANT
MAT ABSORB  FLUID 56X50 GRAY (MISCELLANEOUS) ×2
MAT ABSORB FLUID 56X50 GRAY (MISCELLANEOUS) ×1 IMPLANT
NDL SPNL 20GX3.5 QUINCKE YW (NEEDLE) ×2 IMPLANT
NEEDLE SPNL 20GX3.5 QUINCKE YW (NEEDLE) ×4 IMPLANT
NS IRRIG 1000ML POUR BTL (IV SOLUTION) ×2 IMPLANT
PACK HIP COMPR (MISCELLANEOUS) ×2 IMPLANT
SCALPEL PROTECTED #10 DISP (BLADE) ×4 IMPLANT
SHELL ACETABULAR SZ 52 DM (Shell) ×1 IMPLANT
SOLUTION IRRIG SURGIPHOR (IV SOLUTION) ×2 IMPLANT
STAPLER SKIN PROX 35W (STAPLE) ×2 IMPLANT
STRAP SAFETY 5IN WIDE (MISCELLANEOUS) ×2 IMPLANT
SUT DVC 2 QUILL PDO  T11 36X36 (SUTURE) ×2
SUT DVC 2 QUILL PDO T11 36X36 (SUTURE) ×1 IMPLANT
SUT DVC VLOC 3-0 CL 6 P-12 (SUTURE) ×1 IMPLANT
SUT SILK 0 (SUTURE) ×2
SUT SILK 0 30XBRD TIE 6 (SUTURE) ×1 IMPLANT
SUT VIC AB 1 CT1 36 (SUTURE) ×2 IMPLANT
SYR 50ML LL SCALE MARK (SYRINGE) ×4 IMPLANT
SYR BULB IRRIG 60ML STRL (SYRINGE) ×2 IMPLANT
TRAP FLUID SMOKE EVACUATOR (MISCELLANEOUS) ×2 IMPLANT
TRAY FOLEY MTR SLVR 16FR STAT (SET/KITS/TRAYS/PACK) ×2 IMPLANT
WATER STERILE IRR 1000ML POUR (IV SOLUTION) ×2 IMPLANT

## 2022-02-16 NOTE — Op Note (Signed)
02/16/2022  2:08 PM  PATIENT:  Sarah Cunningham  76 y.o. female  PRE-OPERATIVE DIAGNOSIS:  Osteonecrosis of right hip  M87.9  POST-OPERATIVE DIAGNOSIS:  Osteonecrosis of right hip  M87.9  PROCEDURE:  Procedure(s): TOTAL HIP ARTHROPLASTY ANTERIOR APPROACH (Right)  SURGEON: Laurene Footman, MD  ASSISTANTS: none  ANESTHESIA:   spinal  EBL:  Total I/O In: -  Out: 450 [Urine:450]  BLOOD ADMINISTERED:none  DRAINS:     None wound VAC LOCAL MEDICATIONS USED:  MARCAINE    and OTHER Exparel  SPECIMEN:  Source of Specimen:    Right femoral head  DISPOSITION OF SPECIMEN:  PATHOLOGY  COUNTS:  YES  TOURNIQUET:  * No tourniquets in log *  IMPLANTS: Medacta cemented #2 AMIS stem with 52 mm Mpact DM cup and liner with metal S 28 mm head  DICTATION: .Dragon Dictation  DICTATION: .Dragon Dictation   The patient was brought to the operating room and after spinal anesthesia was obtained patient was placed on the operative table with the ipsilateral foot into the Medacta attachment, contralateral leg on a well-padded table. C-arm was brought in and preop template x-ray taken. After prepping and draping in usual sterile fashion appropriate patient identification and timeout procedures were completed. Anterior approach to the hip was obtained and centered over the greater trochanter and TFL muscle. The subcutaneous tissue was incised hemostasis being achieved by electrocautery. TFL fascia was incised and the muscle retracted laterally deep retractor placed. The lateral femoral circumflex vessels were identified and ligated. The anterior capsule was exposed and a capsulotomy performed. The neck was identified and a femoral neck cut carried out with a saw. The head was removed without difficulty and showed sclerotic femoral head and acetabulum. Reaming was carried out to 52 mm and a 52 mm cup trial gave appropriate tightness to the acetabular component a 52 DM cup was impacted into position. The leg was  then externally rotated and ischiofemoral and pubofemoral releases carried out. The femur was sequentially broached to a size 3.  A 3 cement restrictor was put down the canal the canal prepped and then cement inserted with pressurization and a 2 Imus standard stem and then inserted to the appropriate level.  Excess cement removed and sponge placed in the stem protect it from any loose cement after the cemented cement was set the sponges removed and trial was made with an S head and this was chosen for final implant.  The M metal head with 50 mm DM cup were applied to the stem the hip reduced and was stable in appearance and leg lengths appeared appropriate.  The hip was reduced and was stable the wound was thoroughly irrigated with fibrillar placed along the posterior capsule and medial neck. The deep fascia ws closed using a heavy Quill after infiltration of 30 cc of quarter percent Sensorcaine with epinephrine.3-0 V-loc to close the skin with skin staples.  Incisional wound VAC applied and e patient was sent to recovery in stable condition.     PLAN OF CARE: Admit for overnight observation  PATIENT DISPOSITION:  PACU - hemodynamically stable.

## 2022-02-16 NOTE — TOC Progression Note (Signed)
Transition of Care [facility]) - Progression Note    Patient Details  Name: Sarah Cunningham MRN: [id number] Date of Birth: May 08, 1946  Transition of Care [facility]) CM/SW [place], RN Phone Number: 02/16/2022, 3:27 PM  Clinical Narrative:    The patient attented the joint in motion calss and filled out the Worksheet, she indicated that she has a rolling walker but doe snot have a 3 in 1, she does have a shower seat and a hand held shower, her daughter Ivin Booty will be assisting her, She is set up with [facility] prior to surgery for [facility] services  PT to evaluate and make recommendation   Expected Discharge Plan: [place] Barriers to Discharge: Continued Medical Work up  Expected Discharge Plan and Services Expected Discharge Plan: [place]   Discharge Planning Services: CM Consult   Living arrangements for the past 2 months: Single Family Home                           HH Arranged: PT [facility] Agency: [place] Date [place]: 02/16/22 Time HH Agency Contacted: 25 Representative spoke with at [place]: [place] Determinants of Health ([place]) Interventions    Readmission Risk Interventions     No data to display

## 2022-02-16 NOTE — H&P (Signed)
Chief Complaint  Patient presents with   Left Hip - Post Operative Visit    History of the Present Illness: Sarah Cunningham is a 76 y.o. female here today for follow-up of status post left total hip arthroplasty on 11/17/2021. The patient reports she has severe pain in her right hip and she cannot take ibuprofen. Acetaminophen diminishes the pain but it does not provide complete relief. She also does not Norco as it makes her shaky. She also has some numbness in her fingers. She also has low back pain primarily on the left side. She states she is having right shoulder pain also. Her left leg and ankle are still swollen.   The patient states she is scheduled for right total hip arthroplasty. I have reviewed past medical, surgical, social and family history, and allergies as documented in the EMR.  Past Medical History: Past Medical History:  Diagnosis Date   Arthritis 2013  right hip   Hyperlipidemia   Hypertension   Osteoporosis   Trigeminal neuralgia   Past Surgical History: Past Surgical History:  Procedure Laterality Date   APPENDECTOMY 1985   Toenail removal x 3 11/2015   COLONOSCOPY 09/15/2021  Tubular adenomas/Hyperplastic polyps/No repeat due to age/CTL   ARTHROPLASTY HIP TOTAL Left 11/17/2021   CATARACT EXTRACTION Bilateral  11/03/15 & 11/10/15   Past Family History: Family History  Problem Relation Age of Onset   Lung cancer Mother   Cancer Mother   Other Father  Brain tumor   Brain cancer Father   Diabetes type II Sister   Diabetes Sister   Diabetes type II Brother   Diabetes Brother   COPD Brother   Myocardial Infarction (Heart attack) Maternal Grandmother   Myocardial Infarction (Heart attack) Paternal Grandmother   Myocardial Infarction (Heart attack) Paternal Grandfather   Kidney disease Sister   Kidney disease Sister   Medications: Current Outpatient Medications Ordered in Epic  Medication Sig Dispense Refill   albuterol (PROAIR HFA) 90 mcg/actuation  inhaler Inhale 2 inhalations into the lungs every 4 (four) hours as needed for Wheezing 8.5 g 3   alendronate (FOSAMAX) 70 MG tablet Take 1 tablet (70 mg total) by mouth every 7 (seven) days Take with a full glass of water. Do not lie down for the next 30 min. 4 tablet 11   amLODIPine (NORVASC) 5 MG tablet TAKE ONE TABLET BY MOUTH DAILY 90 tablet 1   antiox #8/om3/dha/epa/lut/zeax (PRESERVISION AREDS 2, OMEGA-3, ORAL) Take 1 tablet by mouth 2 (two) times daily   aspirin 81 MG EC tablet Take 81 mg by mouth once daily   atorvastatin (LIPITOR) 40 MG tablet TAKE ONE TABLET BY MOUTH DAILY 90 tablet 1   baclofen (LIORESAL) 10 MG tablet Take 1 tablet (10 mg total) by mouth 3 (three) times daily 240 tablet 1   docusate (COLACE) 100 MG capsule Take 100 mg by mouth 2 (two) times daily   HYDROcodone-acetaminophen (NORCO) 5-325 mg tablet Take by mouth   ibuprofen (ADVIL,MOTRIN) 200 MG tablet Take 400 mg by mouth 2 (two) times daily as needed for Pain   lamoTRIgine (LAMICTAL) 200 MG tablet 200 mg in the morning and 400 mg nightly. 225 tablet 3   methocarbamoL (ROBAXIN) 500 MG tablet Take by mouth   multivitamin tablet Take 1 tablet by mouth once daily   ondansetron (ZOFRAN) 4 MG tablet Take 4 mg by mouth every 6 (six) hours as needed   perindopril (ACEON) 4 MG tablet Take 4 mg by mouth once daily (  Patient not taking: Reported on 12/30/2021)   pregabalin (LYRICA) 50 MG capsule Take 2 capsules (100 mg total) by mouth 2 (two) times daily for 30 days 120 capsule 5   No current Epic-ordered facility-administered medications on file.   Allergies: Allergies  Allergen Reactions   Tegretol [Carbamazepine] Rash    Body mass index is 18.56 kg/m.  Review of Systems: A comprehensive 14 point ROS was performed, reviewed, and the pertinent orthopaedic findings are documented in the HPI.  Vitals:  02/10/22 0958  BP: 132/74    General Physical Examination:   General/Constitutional: No apparent distress:  well-nourished and well developed. Eyes: Pupils equal, round with synchronous movement. Lungs: Clear to auscultation HEENT: Normal Vascular: No edema, swelling or tenderness, except as noted in detailed exam. Cardiac: Heart rate and rhythm is regular. Integumentary: No impressive skin lesions present, except as noted in detailed exam. Neuro/Psych: Normal mood and affect, oriented to person, place, and time.  Musculoskeletal Examination: Left hip: Internal rotation is 30 degrees, external rotation 30 degrees.  Right hip: Internal rotation 0 degrees, external 20 degrees.  Right shoulder: Internal rotation 0 degrees.  Radiographs:  Left hip x-ray from 12/30/2021 showed well cemented stem with appropriate offset and length. Severe right hip osteoarthritis also present.   Assessment: ICD-10-CM  1. Status post total replacement of left hip Z96.642  2. Osteonecrosis of left hip (CMS-HCC) M87.9  3. Bilateral hip osteonecrosis (CMS-HCC) M87.9   Plan:  The patient has severe arthritis in her right hip, back and right shoulder. She also has some tingling in her fingers, which I told will get better when she puts less pressure on her hand. I also explained that getting her right hip surgery will also help her back as it will allow her to put less stress on her back too. I also advised on the use of TENS unit for her back pain. The patient is scheduled for right hip total arthroplasty on 02/16/2022.  Surgical Risks:  The nature of the condition and the proposed procedure has been reviewed in detail with the patient. Surgical versus non-surgical options and prognosis for recovery have been reviewed and the inherent risks and benefits of each have been discussed including the risks of infection, bleeding, injury to nerves/blood vessels/tendons, incomplete relief of symptoms, persisting pain and/or stiffness, loss of function, complex regional pain syndrome, failure of the procedure, as  appropriate.  Teeth: No loose or removable oral devices.  Document Attestation: Clementeen Hoof, have reviewed and updated documentation for [facility], MD, utilizing [employer] DAX. Electronically signed by Lauris Poag, MD at 02/10/2022 9:27 PM EDT  Reviewed  H+P. No changes noted.

## 2022-02-16 NOTE — Evaluation (Signed)
Physical Therapy Evaluation Patient Details Name: Sarah Cunningham MRN: [id number] DOB: 02-13-1946 Today's Date: 02/16/2022  History of Present Illness  Pt is a 76 yo F diagnosed with osteonecrosis of the right hip and is s/p elective R THA.  PMH includes L THA, dyspnea, and HTN.   Clinical Impression  Pt was pleasant and motivated to participate during the session and put forth good effort throughout. Pt required no physical assistance with bed mobility tasks and transfers and was able to do low amplitude BLE marching at the EOB with a RW without incident.  While training for small steps at the EOB, however, the pt's RLE buckled and the pt required heavy Mod A to prevent fall.  Once pt was steady she was able to take several small side steps inside the RW towards the [facility] without further incident.  Pt anxious to return home this date but agreed with PT assessment that she was unsafe to do so at this time.  Based on pt's PLOF as well as functional mobility during POD #0 PT evaluation the pt is expected to make good progress towards goals while in acute care.  Pt will benefit from HHPT upon discharge to safely address deficits listed in patient problem list for decreased caregiver assistance and eventual return to PLOF.         Recommendations for follow up therapy are one component of a multi-disciplinary discharge planning process, led by the attending physician.  Recommendations may be updated based on patient status, additional functional criteria and insurance authorization.  Follow Up Recommendations Home health PT      Assistance Recommended at Discharge Intermittent Supervision/Assistance  Patient can return home with the following  A little help with walking and/or transfers;A little help with bathing/dressing/bathroom;Assistance with cooking/housework;Assist for transportation;Help with stairs or ramp for entrance    Equipment Recommendations None recommended by PT;Other (comment)  (Pt declined BSC)  Recommendations for Other Services       Functional Status Assessment Patient has had a recent decline in their functional status and demonstrates the ability to make significant improvements in function in a reasonable and predictable amount of time.     Precautions / Restrictions Precautions Precautions: Anterior Hip Precaution Booklet Issued: Yes (comment) Restrictions Weight Bearing Restrictions: Yes RLE Weight Bearing: Weight bearing as tolerated      Mobility  Bed Mobility Overal bed mobility: Modified Independent             General bed mobility comments: Min extra time and effort    Transfers Overall transfer level: Needs assistance Equipment used: Rolling walker (2 wheels) Transfers: Sit to/from Stand Sit to Stand: Min guard           General transfer comment: Min verbal cues for sequencing with fair concentric control and poor eccentric control    Ambulation/Gait Ambulation/Gait assistance: Mod assist Gait Distance (Feet): 3 Feet Assistive device: Rolling walker (2 wheels) Gait Pattern/deviations: Step-to pattern, Decreased stance time - right, Decreased step length - left Gait velocity: decreased     General Gait Details: Pt able to take several small steps at the EOB with mod verbal and visual cues for sequencing with one heavy RLE buckle that required Mod A to prevent fall; min difficulty advancing and controlling placement position of the R foot  Stairs            Wheelchair Mobility    Modified Rankin (Stroke Patients Only)       Balance Overall balance assessment: Needs  assistance Sitting-balance support: Bilateral upper extremity supported, Feet supported Sitting balance-Leahy Scale: Fair     Standing balance support: Bilateral upper extremity supported, During functional activity, Reliant on assistive device for balance Standing balance-Leahy Scale: Poor                                Pertinent Vitals/Pain Pain Assessment Pain Assessment: 0-10 Pain Score: 1  Pain Location: R hip Pain Descriptors / Indicators: Sore Pain Intervention(s): Premedicated before session, Repositioned, Ice applied, Monitored during session    Home Living Family/patient expects to be discharged to:: Private residence Living Arrangements: Alone Available Help at Discharge: Family;Neighbor;Available 24 hours/day Type of Home: House Home Access: Stairs to enter Entrance Stairs-Rails: Right Entrance Stairs-Number of Steps: 3   Home Layout: One level Home Equipment: Conservation officer, nature (2 wheels);Cane - single point;Shower seat;Grab bars - tub/shower      Prior Function Prior Level of Function : Independent/Modified Independent             Mobility Comments: Mod Ind amb community distances with a SPC, no fall history, uses a SPC secondary to R hip pain ADLs Comments: Lives alone but daughter is in town staying with patient with 24/7 asssist for two weeks     Hand Dominance        Extremity/Trunk Assessment   Upper Extremity Assessment Upper Extremity Assessment: Overall WFL for tasks assessed    Lower Extremity Assessment Lower Extremity Assessment: RLE deficits/detail RLE Deficits / Details: BLE ankle strength, AROM, and sensation to light touch WNL RLE: Unable to fully assess due to pain RLE Sensation: WNL       Communication   Communication: No difficulties  Cognition Arousal/Alertness: Awake/alert Behavior During Therapy: WFL for tasks assessed/performed Overall Cognitive Status: Within Functional Limits for tasks assessed                                          General Comments      Exercises Total Joint Exercises Ankle Circles/Pumps: AROM, Strengthening, Both, 10 reps Quad Sets: Strengthening, Both, 10 reps Long Arc Quad: AROM, Strengthening, Both, 10 reps Knee Flexion: AROM, Strengthening, Both, 10 reps Marching in Standing:  Strengthening, Both, 5 reps, Standing Other Exercises Other Exercises: HEP education per handout Other Exercises: Anterior hip precaution education   Assessment/Plan    PT Assessment Patient needs continued PT services  PT Problem List Decreased strength;Decreased activity tolerance;Decreased balance;Decreased mobility;Decreased knowledge of use of DME;Pain;Decreased knowledge of precautions       PT Treatment Interventions DME instruction;Gait training;Stair training;Functional mobility training;Therapeutic activities;Therapeutic exercise;Balance training;Patient/family education    PT Goals (Current goals can be found in the Care Plan section)  Acute Rehab PT Goals Patient Stated Goal: To travel and take a cruise PT Goal Formulation: With patient Time For Goal Achievement: 03/01/22 Potential to Achieve Goals: Good    Frequency BID     Co-evaluation               AM-PAC PT "6 Clicks" Mobility  Outcome Measure Help needed turning from your back to your side while in a flat bed without using bedrails?: A Little Help needed moving from lying on your back to sitting on the side of a flat bed without using bedrails?: A Little Help needed moving to and from a bed to a chair (  including a wheelchair)?: A Little Help needed standing up from a chair using your arms (e.g., wheelchair or bedside chair)?: A Little Help needed to walk in hospital room?: A Lot Help needed climbing 3-5 steps with a railing? : Total 6 Click Score: 15    End of Session Equipment Utilized During Treatment: Gait belt Activity Tolerance: Patient tolerated treatment well Patient left: in bed;with call bell/phone within reach;with nursing/sitter in room Nurse Communication: Mobility status;Weight bearing status;Other (comment) (Pt's RLE buckled during min ambulation at EOB) PT Visit Diagnosis: Unsteadiness on feet (R26.81);Other abnormalities of gait and mobility (R26.89);Muscle weakness (generalized)  (M62.81);Pain Pain - Right/Left: Right Pain - part of body: Hip    Time: 6314-9702 PT Time Calculation (min) (ACUTE ONLY): 35 min   Charges:   PT Evaluation $PT Eval Moderate Complexity: 1 Mod PT Treatments $Therapeutic Exercise: 8-22 mins        D. Royetta Asal PT, DPT 02/16/22, 5:10 PM

## 2022-02-16 NOTE — Anesthesia Procedure Notes (Signed)
Spinal  Patient location during procedure: OR Reason for block: surgical anesthesia Staffing Performed: anesthesiologist  Anesthesiologist: Mikell Camp, MD Performed by: Sarha Bartelt, MD Authorized by: Railey Glad, MD   Preanesthetic Checklist Completed: patient identified, IV checked, site marked, risks and benefits discussed, surgical consent, monitors and equipment checked, pre-op evaluation and timeout performed Spinal Block Patient position: sitting Prep: ChloraPrep and site prepped and draped Patient monitoring: heart rate, continuous pulse ox, blood pressure and cardiac monitor Approach: midline Location: L3-4 Injection technique: single-shot Needle Needle type: Quincke  Needle gauge: 22 G Needle length: 9 cm Assessment Sensory level: T10 Events: CSF return Additional Notes Meticulous sterile technique used throughout (CHG prep, sterile gloves, sterile drape). Negative paresthesia. Negative blood return. Positive free-flowing CSF. Expiration date of kit checked and confirmed. Patient tolerated procedure well, without complications.     

## 2022-02-16 NOTE — Anesthesia Preprocedure Evaluation (Signed)
Anesthesia Evaluation  Patient identified by MRN, date of birth, ID band Patient awake    Reviewed: Allergy & Precautions, NPO status , Patient's Chart, lab work & pertinent test results  History of Anesthesia Complications Negative for: history of anesthetic complications  Airway Mallampati: II  TM Distance: >3 FB Neck ROM: Full    Dental  (+) Poor Dentition, Chipped   Pulmonary shortness of breath, neg sleep apnea, neg COPD, Current Smoker and Patient abstained from smoking.,    Pulmonary exam normal breath sounds clear to auscultation       Cardiovascular Exercise Tolerance: Good METShypertension, Pt. on medications (-) CAD and (-) Past MI (-) dysrhythmias  Rhythm:Regular Rate:Normal - Systolic murmurs    Neuro/Psych negative neurological ROS  negative psych ROS   GI/Hepatic neg GERD  ,(+)     (-) substance abuse  ,   Endo/Other  neg diabetes  Renal/GU negative Renal ROS     Musculoskeletal  (+) Arthritis ,   Abdominal   Peds  Hematology   Anesthesia Other Findings Past Medical History: 2013: Arthritis     Comment:  rt hip No date: Dyspnea No date: HLD (hyperlipidemia) No date: Hypertension No date: Tobacco dependence No date: Trigeminal neuralgia  Reproductive/Obstetrics                             Anesthesia Physical Anesthesia Plan  ASA: 2  Anesthesia Plan: Spinal   Post-op Pain Management: Ofirmev IV (intra-op)*   Induction: Intravenous  PONV Risk Score and Plan: 1 and Ondansetron, Dexamethasone, Propofol infusion, TIVA and Treatment may vary due to age or medical condition  Airway Management Planned: Natural Airway  Additional Equipment: None  Intra-op Plan:   Post-operative Plan:   Informed Consent: I have reviewed the patients History and Physical, chart, labs and discussed the procedure including the risks, benefits and alternatives for the proposed  anesthesia with the patient or authorized representative who has indicated his/her understanding and acceptance.   Patient has DNR.  Discussed DNR with patient and Continue DNR.     Plan Discussed with: CRNA and Surgeon  Anesthesia Plan Comments: (Patient very passionate about having her DNR wishes respected. She understands the differences in operating room critical incidents vs out in the field, and that often we have ways to reverse any issues like heart stopping or breathing cessation. She understands that placing a breathing tube in for poor ventilation is different than placing one during a code with no idea when it would be removed (she is accepting of breathing tube placement in the former situation). She understands anesthesia is a form of resuscitation, and she accepts the standard and usual care that is provided during the course of this procedure. However, she declines any chest compressions in the event of a code, or prolonged breathing tube placement. This was all discussed in the presence of her daughter at bedside.  Discussed R/B/A of neuraxial anesthesia technique with patient: - rare risks of spinal/epidural hematoma, nerve damage, infection - Risk of PDPH - Risk of nausea and vomiting - Risk of conversion to general anesthesia and its associated risks, including sore throat, damage to lips/eyes/teeth/oropharynx, and rare risks such as cardiac and respiratory events. - Risk of allergic reactions  Discussed the role of CRNA in patient's perioperative care.  Patient voiced understanding.)        Anesthesia Quick Evaluation

## 2022-02-16 NOTE — Progress Notes (Signed)
PT at bedside.

## 2022-02-16 NOTE — Transfer of Care (Signed)
Immediate Anesthesia Transfer of Care Note  Patient: Sarah Cunningham  Procedure(s) Performed: TOTAL HIP ARTHROPLASTY ANTERIOR APPROACH (Right: Hip)  Patient Location: PACU  Anesthesia Type:Spinal  Level of Consciousness: awake  Airway & Oxygen Therapy: Patient Spontanous Breathing  Post-op Assessment: Report given to RN and Post -op Vital signs reviewed and stable  Post vital signs: Reviewed and stable  Last Vitals:  Vitals Value Taken Time  BP 112/71 02/16/22 1400  Temp    Pulse 91 02/16/22 1403  Resp 15 02/16/22 1403  SpO2 94 % 02/16/22 1403  Vitals shown include unvalidated device data.  Last Pain:  Vitals:   02/16/22 1026  TempSrc: Temporal  PainSc: 3          Complications: No notable events documented.

## 2022-02-16 NOTE — Progress Notes (Signed)
PT at bedside, patient unable to weight bear, will stay overnight. Patient aware and verbalizes understanding. Family updated.

## 2022-02-17 ENCOUNTER — Encounter: Payer: Self-pay | Admitting: Orthopedic Surgery

## 2022-02-17 DIAGNOSIS — M87051 Idiopathic aseptic necrosis of right femur: Secondary | ICD-10-CM | POA: Diagnosis not present

## 2022-02-17 LAB — CBC
HCT: 28.9 % — ABNORMAL LOW (ref 36.0–46.0)
Hemoglobin: 9.5 g/dL — ABNORMAL LOW (ref 12.0–15.0)
MCH: 31.8 pg (ref 26.0–34.0)
MCHC: 32.9 g/dL (ref 30.0–36.0)
MCV: 96.7 fL (ref 80.0–100.0)
Platelets: 212 10*3/uL (ref 150–400)
RBC: 2.99 MIL/uL — ABNORMAL LOW (ref 3.87–5.11)
RDW: 13.2 % (ref 11.5–15.5)
WBC: 12.1 10*3/uL — ABNORMAL HIGH (ref 4.0–10.5)
nRBC: 0 % (ref 0.0–0.2)

## 2022-02-17 LAB — BASIC METABOLIC PANEL
Anion gap: 3 — ABNORMAL LOW (ref 5–15)
BUN: 21 mg/dL (ref 8–23)
CO2: 27 mmol/L (ref 22–32)
Calcium: 9.2 mg/dL (ref 8.9–10.3)
Chloride: 107 mmol/L (ref 98–111)
Creatinine, Ser: 0.6 mg/dL (ref 0.44–1.00)
GFR, Estimated: >60 mL/min (ref >60)
Glucose, Bld: 130 mg/dL — ABNORMAL HIGH (ref 70–99)
Potassium: 4.5 mmol/L (ref 3.5–5.1)
Sodium: 137 mmol/L (ref 135–145)

## 2022-02-17 MED ORDER — ENOXAPARIN SODIUM 40 MG/0.4ML IJ SOSY: 40.0000 mg | PREFILLED_SYRINGE | INTRAMUSCULAR | 0 refills | Status: DC

## 2022-02-17 MED ORDER — TRAMADOL HCL 50 MG PO TABS: 50.0000 mg | ORAL_TABLET | Freq: Four times a day (QID) | ORAL | 0 refills | Status: DC | PRN

## 2022-02-17 MED ORDER — HYDROCODONE-ACETAMINOPHEN 5-325 MG PO TABS
1.0000 | ORAL_TABLET | ORAL | 0 refills | Status: DC | PRN
Start: 2022-02-17 — End: 2023-05-25

## 2022-02-17 MED ORDER — DOCUSATE SODIUM 100 MG PO CAPS: 100.0000 mg | ORAL_CAPSULE | Freq: Two times a day (BID) | ORAL | 0 refills | Status: DC

## 2022-02-17 NOTE — Discharge Instructions (Signed)

## 2022-02-17 NOTE — Plan of Care (Signed)

## 2022-02-17 NOTE — Plan of Care (Signed)
Patient discharged per MD orders at this time.All discharged instructions,education and medications reviewed with the patient.Pt expressed understanding and will comply with dc instructions.follow up appointments was also communicated to the patient.no verbal c/o or any ssx of distress.Pt was discharged home with HH/PT services per order.Pt was transported home by daughter in a privately owned vehicle.

## 2022-02-17 NOTE — Progress Notes (Signed)
   Subjective: 1 Day Post-Op Procedure(s) (LRB): TOTAL HIP ARTHROPLASTY ANTERIOR APPROACH (Right) Patient reports pain as moderate.   Patient is well, and has had no acute complaints or problems Denies any CP, SOB, ABD pain. We will continue therapy today.  Plan is to go Home after hospital stay.  Objective: Vital signs in last 24 hours: Temp:  [97.2 F (36.2 C)-98.9 F (37.2 C)] 97.6 F (36.4 C) (08/16 0505) Pulse Rate:  [52-94] 60 (08/16 0505) Resp:  [14-20] 20 (08/16 0505) BP: (112-156)/(62-97) 147/82 (08/16 0505) SpO2:  [90 %-98 %] 94 % (08/16 0505)  Intake/Output from previous day: 08/15 0701 - 08/16 0700 In: 2101.1 [I.V.:2001.1; IV Piggyback:100] Out: 1150 [Urine:1150] Intake/Output this shift: No intake/output data recorded.  Recent Labs    02/16/22 1729 02/17/22 0428  HGB 12.2 9.5*   Recent Labs    02/16/22 1729 02/17/22 0428  WBC 16.0* 12.1*  RBC 3.77* 2.99*  HCT 36.9 28.9*  PLT 228 212   Recent Labs    02/16/22 1729 02/17/22 0428  NA  --  137  K  --  4.5  CL  --  107  CO2  --  27  BUN  --  21  CREATININE 0.55 0.60  GLUCOSE  --  130*  CALCIUM  --  9.2   No results for input(s): "LABPT", "INR" in the last 72 hours.  EXAM General - Patient is Alert, Appropriate, and Oriented Extremity - Neurovascular intact Sensation intact distally Intact pulses distally Dorsiflexion/Plantar flexion intact No cellulitis present Compartment soft Dressing - dressing C/D/I and no drainage, provena intact with out drainage Motor Function - intact, moving foot and toes well on exam.   Past Medical History:  Diagnosis Date   Arthritis 2013   rt hip   Dyspnea    HLD (hyperlipidemia)    Hypertension    Tobacco dependence    Trigeminal neuralgia     Assessment/Plan:   1 Day Post-Op Procedure(s) (LRB): TOTAL HIP ARTHROPLASTY ANTERIOR APPROACH (Right) Principal Problem:   Status post total hip replacement, right  Estimated body mass index is 17.81  kg/m as calculated from the following:   Height as of 02/04/22: 5' 6.5" (1.689 m).   Weight as of 02/04/22: 50.8 kg. Advance diet Up with therapy Labs and VSS Pain controlled CM to assist with discharge to home with HHPT today pending completion of PT goals  DVT Prophylaxis - Aspirin, Lovenox, TED hose, and SCDs Weight-Bearing as tolerated to right leg   T. Rachelle Hora, PA-C [place] 02/17/2022, 8:05 AM

## 2022-02-17 NOTE — Anesthesia Postprocedure Evaluation (Signed)
Anesthesia Post Note  Patient: ELDA DUNKERSON  Procedure(s) Performed: TOTAL HIP ARTHROPLASTY ANTERIOR APPROACH (Right: Hip)  Patient location during evaluation: Nursing Unit Anesthesia Type: Spinal Level of consciousness: awake, awake and alert, oriented and patient cooperative Pain management: pain level controlled Vital Signs Assessment: vitals unstable Respiratory status: spontaneous breathing, nonlabored ventilation and respiratory function stable Cardiovascular status: stable and blood pressure returned to baseline Postop Assessment: no headache, no backache, able to ambulate, adequate PO intake, patient able to bend at knees and no apparent nausea or vomiting Anesthetic complications: no   No notable events documented.   Last Vitals:  Vitals:   02/17/22 0037 02/17/22 0505  BP: 127/80 (!) 147/82  Pulse: (!) 59 60  Resp:  20  Temp: 37.2 C 36.4 C  SpO2: 95% 94%    Last Pain:  Vitals:   02/17/22 0558  TempSrc:   PainSc: 7                  Makinley Muscato,  Dehlia Kilner R

## 2022-02-17 NOTE — Progress Notes (Signed)
Physical Therapy Treatment Patient Details Name: Sarah Cunningham MRN: [id number] DOB: 04/01/1946 Today's Date: 02/17/2022   History of Present Illness Pt is a 76 yo F diagnosed with osteonecrosis of the right hip and is s/p elective R THA.  PMH includes L THA, dyspnea, and HTN.    PT Comments    Pt presents to PT in bed and agreeable to participate in therapy services. Pt is motivated and put forth good effort  throughout session. Pt has improved R LE negotiation during all functional mobility tasks since 02/16/22. Pt had no episodes of LE buckling or LOB during today's session. SpO2 at 92% on RA at end of session, RN notified, and SPT instructed to leave pt on RA. HR WNL throughout session. Pt would benefit from skilled HHPT to address above deficits in strength, functional mobility, and activity tolerance to promote optimal return to PLOF.    Recommendations for follow up therapy are one component of a multi-disciplinary discharge planning process, led by the attending physician.  Recommendations may be updated based on patient status, additional functional criteria and insurance authorization.  Follow Up Recommendations  Home health PT     Assistance Recommended at Discharge Intermittent Supervision/Assistance  Patient can return home with the following A little help with walking and/or transfers;A little help with bathing/dressing/bathroom;Assistance with cooking/housework;Assist for transportation;Help with stairs or ramp for entrance   Equipment Recommendations  None recommended by PT   Recommendations for Other Services       Precautions / Restrictions Precautions Precautions: Anterior Hip Precaution Booklet Issued: Yes (comment) Restrictions Weight Bearing Restrictions: Yes RLE Weight Bearing: Weight bearing as tolerated     Mobility  Bed Mobility Overal bed mobility: Modified Independent             General bed mobility comments: Min extra time and effort     Transfers Overall transfer level: Needs assistance Equipment used: Rolling walker (2 wheels) Transfers: Sit to/from Stand Sit to Stand: Min guard           General transfer comment: some verbal cueing for sequencing w improved concentric and eccentric control    Ambulation/Gait Ambulation/Gait assistance: Min guard Gait Distance (Feet): 200 Feet Assistive device: Rolling walker (2 wheels) Gait Pattern/deviations: Step-through pattern, Decreased step length - left, Decreased stance time - right, Antalgic Gait velocity: decreased     General Gait Details: required CGA, no LOB   Stairs Stairs: Yes Stairs assistance: Min guard Stair Management: One rail Right, Step to pattern (45 deg) Number of Stairs: 4 General stair comments: edu on "up w good/down w bad" technique for stair ascent/descent. Pt verbalized and demonstrated understanding   Wheelchair Mobility    Modified Rankin (Stroke Patients Only)       Balance Overall balance assessment: Needs assistance Sitting-balance support: Bilateral upper extremity supported, Feet supported Sitting balance-Leahy Scale: Good     Standing balance support: Bilateral upper extremity supported, During functional activity, Reliant on assistive device for balance Standing balance-Leahy Scale: Fair                              Cognition Arousal/Alertness: Awake/alert Behavior During Therapy: WFL for tasks assessed/performed Overall Cognitive Status: Within Functional Limits for tasks assessed  Exercises Other Exercises Other Exercises: car transfer edu Other Exercises: stair negotation training    General Comments        Pertinent Vitals/Pain Pain Assessment Pain Assessment: 0-10 Pain Score: 6  Pain Location: R hip Pain Descriptors / Indicators: Sore Pain Intervention(s): Monitored during session    Home Living                           Prior Function            PT Goals (current goals can now be found in the care plan section) Acute Rehab PT Goals Patient Stated Goal: To travel and take a cruise PT Goal Formulation: With patient Time For Goal Achievement: 03/01/22 Potential to Achieve Goals: Good Progress towards PT goals: Progressing toward goals    Frequency    BID      PT Plan Current plan remains appropriate    Co-evaluation              AM-PAC PT "6 Clicks" Mobility   Outcome Measure  Help needed turning from your back to your side while in a flat bed without using bedrails?: A Little Help needed moving from lying on your back to sitting on the side of a flat bed without using bedrails?: A Little Help needed moving to and from a bed to a chair (including a wheelchair)?: A Little Help needed standing up from a chair using your arms (e.g., wheelchair or bedside chair)?: A Little Help needed to walk in hospital room?: A Little Help needed climbing 3-5 steps with a railing? : A Little 6 Click Score: 18    End of Session Equipment Utilized During Treatment: Gait belt Activity Tolerance: Patient tolerated treatment well Patient left: in chair;with call bell/phone within reach;with chair alarm set;with SCD's reapplied Nurse Communication: Mobility status;Weight bearing status;Other (comment) PT Visit Diagnosis: Unsteadiness on feet (R26.81);Other abnormalities of gait and mobility (R26.89);Muscle weakness (generalized) (M62.81);Pain Pain - Right/Left: Right Pain - part of body: Hip     Time: 0900-0940 PT Time Calculation (min) (ACUTE ONLY): 40 min  Charges:                        Glenice Laine MPH, SPT 02/17/22, 11:15 AM

## 2022-02-17 NOTE — Discharge Summary (Signed)
Physician Discharge Summary  Patient ID: Sarah Cunningham MRN: [id number] DOB/AGE: Nov 25, 1945 76 y.o.  Admit date: 02/16/2022 Discharge date: 02/17/2022  Admission Diagnoses:  Status post total hip replacement, right [Z96.641]   Discharge Diagnoses: Patient Active Problem List   Diagnosis Date Noted   Status post total hip replacement, right 02/16/2022   Status post total hip replacement, left 11/17/2021   Trigeminal neuralgia 11/19/2016   Osteopenia of multiple sites 10/29/2016   Tobacco dependence 10/22/2016   Essential hypertension 04/09/2016   Pure hypercholesterolemia 04/09/2016   Vaccine counseling 04/09/2016    Past Medical History:  Diagnosis Date   Arthritis 2013   rt hip   Dyspnea    HLD (hyperlipidemia)    Hypertension    Tobacco dependence    Trigeminal neuralgia      Transfusion: none   Consultants (if any):   Discharged Condition: Improved  Hospital Course: LAUREE YURICK is an 76 y.o. female who was admitted 02/16/2022 with a diagnosis of Status post total hip replacement, right and went to the operating room on 02/16/2022 and underwent the above named procedures.    Surgeries: Procedure(s): TOTAL HIP ARTHROPLASTY ANTERIOR APPROACH on 02/16/2022 Patient tolerated the surgery well. Taken to PACU where she was stabilized and then transferred to the orthopedic floor.  Started on Lovenox 40 mg q 24 hrs. Foot pumps applied bilaterally at 80 mm. Heels elevated on bed with rolled towels. No evidence of DVT. Negative Homan. Physical therapy started on day #1 for gait training and transfer. OT started day #1 for ADL and assisted devices.  Patient's foley was d/c on day #1. Patient's IV  was d/c on day #1.  On post op day #1 patient was stable and ready for discharge to home with HHPT.    She was given perioperative antibiotics:  Anti-infectives (From admission, onward)    Start     Dose/Rate Route Frequency Ordered Stop   02/16/22 1900  ceFAZolin  (ANCEF) IVPB 1 g/50 mL premix        1 g 100 mL/hr over 30 Minutes Intravenous Every 6 hours 02/16/22 1707 02/17/22 0110   02/16/22 1022  ceFAZolin (ANCEF) 2-4 GM/100ML-% IVPB       Note to Pharmacy: Maryagnes Amos B: cabinet override      02/16/22 1022 02/16/22 1250   02/16/22 0600  ceFAZolin (ANCEF) IVPB 2g/100 mL premix        2 g 200 mL/hr over 30 Minutes Intravenous On call to O.R. 02/15/22 2248 02/16/22 1249     .  She was given sequential compression devices, early ambulation, and Lovenox TEDs for DVT prophylaxis.  She benefited maximally from the hospital stay and there were no complications.    Recent vital signs:  Vitals:   02/17/22 0505 02/17/22 0815  BP: (!) 147/82 (!) 141/84  Pulse: 60 67  Resp: 20 15  Temp: 97.6 F (36.4 C) 97.7 F (36.5 C)  SpO2: 94% 94%    Recent laboratory studies:  Lab Results  Component Value Date   HGB 9.5 (L) 02/17/2022   HGB 12.2 02/16/2022   HGB 14.2 02/04/2022   Lab Results  Component Value Date   WBC 12.1 (H) 02/17/2022   PLT 212 02/17/2022   No results found for: "INR" Lab Results  Component Value Date   NA 137 02/17/2022   K 4.5 02/17/2022   CL 107 02/17/2022   CO2 27 02/17/2022   BUN 21 02/17/2022   CREATININE 0.60 02/17/2022  GLUCOSE 130 (H) 02/17/2022    Discharge Medications:   Allergies as of 02/17/2022       Reactions   Tegretol [carbamazepine] Rash        Medication List     STOP taking these medications    ibuprofen 200 MG tablet Commonly known as: ADVIL   methocarbamol 500 MG tablet Commonly known as: ROBAXIN       TAKE these medications    acetaminophen 500 MG tablet Commonly known as: TYLENOL Take 500 mg by mouth every 6 (six) hours as needed.   albuterol 108 (90 Base) MCG/ACT inhaler Commonly known as: VENTOLIN HFA Inhale 1-2 puffs into the lungs every 4 (four) hours as needed for wheezing or shortness of breath.   alendronate 70 MG tablet Commonly known as: FOSAMAX Take  70 mg by mouth every Sunday.   amLODipine 5 MG tablet Commonly known as: NORVASC Take 5 mg by mouth at bedtime.   aspirin EC 81 MG tablet Take 81 mg by mouth daily. Swallow whole.   atorvastatin 40 MG tablet Commonly known as: LIPITOR Take 40 mg by mouth at bedtime.   baclofen 10 MG tablet Commonly known as: LIORESAL Take 10-20 mg by mouth See admin instructions. 10 mg in the morning, 20 mg in the evening   docusate sodium 100 MG capsule Commonly known as: COLACE Take 1 capsule (100 mg total) by mouth 2 (two) times daily. What changed: Another medication with the same name was added. Make sure you understand how and when to take each.   docusate sodium 100 MG capsule Commonly known as: COLACE Take 1 capsule (100 mg total) by mouth 2 (two) times daily. What changed: You were already taking a medication with the same name, and this prescription was added. Make sure you understand how and when to take each.   enoxaparin 40 MG/0.4ML injection Commonly known as: LOVENOX Inject 0.4 mLs (40 mg total) into the skin daily for 14 days. Start taking on: February 18, 2022   HYDROcodone-acetaminophen 5-325 MG tablet Commonly known as: NORCO/VICODIN Take 1-2 tablets by mouth every 4 (four) hours as needed for moderate pain (pain score 4-6).   lamoTRIgine 200 MG tablet Commonly known as: LAMICTAL Take 200 mg by mouth See admin instructions. 200 mg in the morning, 200 mg in the evening   Multi-Vitamins Tabs Take 1 tablet by mouth daily.   ondansetron 4 MG tablet Commonly known as: ZOFRAN Take 1 tablet (4 mg total) by mouth every 6 (six) hours as needed for nausea. What changed: Another medication with the same name was added. Make sure you understand how and when to take each.   ondansetron 4 MG tablet Commonly known as: Zofran Take 1 tablet (4 mg total) by mouth daily as needed for nausea or vomiting. What changed: You were already taking a medication with the same name, and this  prescription was added. Make sure you understand how and when to take each.   perindopril 4 MG tablet Commonly known as: ACEON Take 4 mg by mouth every evening.   PRESERVISION AREDS 2+MULTI VIT PO Take 1 capsule by mouth 2 (two) times daily.   Systane Balance 0.6 % Soln Generic drug: Propylene Glycol Place 1 drop into both eyes daily.   traMADol 50 MG tablet Commonly known as: ULTRAM Take 1 tablet (50 mg total) by mouth every 6 (six) hours as needed.        Diagnostic Studies: DG HIP UNILAT W OR W/O PELVIS 2-3  VIEWS RIGHT  Result Date: 02/16/2022 CLINICAL DATA:  Status post right hip replacement. EXAM: DG HIP (WITH OR WITHOUT PELVIS) 2-3V RIGHT COMPARISON:  None Available. FINDINGS: Status post right hip placement with expected overlying gas and soft tissue swelling. No evidence of immediate hardware complication. Hip is located. Partially imaged lower lumbar degenerative change. IMPRESSION: Right total hip arthroplasty without immediate hardware complication. Electronically Signed   By: Margaretha Sheffield M.D.   On: 02/16/2022 14:46   DG HIP UNILAT WITH PELVIS 1V RIGHT  Result Date: 02/16/2022 CLINICAL DATA:  Right hip arthroplasty. EXAM: DG HIP (WITH OR WITHOUT PELVIS) 1V RIGHT COMPARISON:  MR 05/02/2020 FINDINGS: Two images from portable C-arm radiography demonstrate total right hip arthroplasty. Hardware components are in anatomic alignment. No signs of periprosthetic fracture or dislocation. IMPRESSION: Status post right hip arthroplasty. Electronically Signed   By: Kerby Moors M.D.   On: 02/16/2022 14:04   DG C-Arm 1-60 Min-No Report  Result Date: 02/16/2022 Fluoroscopy was utilized by the requesting physician.  No radiographic interpretation.    Disposition:      Follow-up Information     Duanne Guess, PA-C Follow up in 2 week(s).   Specialties: Orthopedic Surgery, Emergency Medicine Contact information: [address] [place]  [phone]                  Signed: Feliberto Gottron 02/17/2022, 12:06 PM

## 2022-02-18 DIAGNOSIS — Z7982 Long term (current) use of aspirin: Secondary | ICD-10-CM | POA: Diagnosis not present

## 2022-02-18 DIAGNOSIS — Z4789 Encounter for other orthopedic aftercare: Secondary | ICD-10-CM | POA: Diagnosis not present

## 2022-02-18 DIAGNOSIS — E785 Hyperlipidemia, unspecified: Secondary | ICD-10-CM | POA: Diagnosis not present

## 2022-02-18 DIAGNOSIS — F17210 Nicotine dependence, cigarettes, uncomplicated: Secondary | ICD-10-CM | POA: Diagnosis not present

## 2022-02-18 DIAGNOSIS — Z7901 Long term (current) use of anticoagulants: Secondary | ICD-10-CM | POA: Diagnosis not present

## 2022-02-18 DIAGNOSIS — M810 Age-related osteoporosis without current pathological fracture: Secondary | ICD-10-CM | POA: Diagnosis not present

## 2022-02-18 DIAGNOSIS — G500 Trigeminal neuralgia: Secondary | ICD-10-CM | POA: Diagnosis not present

## 2022-02-18 DIAGNOSIS — Z96641 Presence of right artificial hip joint: Secondary | ICD-10-CM | POA: Diagnosis not present

## 2022-02-18 DIAGNOSIS — I10 Essential (primary) hypertension: Secondary | ICD-10-CM | POA: Diagnosis not present

## 2022-02-18 LAB — SURGICAL PATHOLOGY

## 2022-02-24 DIAGNOSIS — M25511 Pain in right shoulder: Secondary | ICD-10-CM | POA: Diagnosis not present

## 2022-02-24 DIAGNOSIS — S46911A Strain of unspecified muscle, fascia and tendon at shoulder and upper arm level, right arm, initial encounter: Secondary | ICD-10-CM | POA: Diagnosis not present

## 2022-02-25 ENCOUNTER — Encounter: Payer: Self-pay | Admitting: Orthopedic Surgery

## 2022-03-02 DIAGNOSIS — M7551 Bursitis of right shoulder: Secondary | ICD-10-CM | POA: Diagnosis not present

## 2022-03-02 DIAGNOSIS — Z96641 Presence of right artificial hip joint: Secondary | ICD-10-CM | POA: Diagnosis not present

## 2022-03-05 DIAGNOSIS — M810 Age-related osteoporosis without current pathological fracture: Secondary | ICD-10-CM | POA: Diagnosis not present

## 2022-03-05 DIAGNOSIS — G500 Trigeminal neuralgia: Secondary | ICD-10-CM | POA: Diagnosis not present

## 2022-03-05 DIAGNOSIS — F17210 Nicotine dependence, cigarettes, uncomplicated: Secondary | ICD-10-CM | POA: Diagnosis not present

## 2022-03-05 DIAGNOSIS — Z96641 Presence of right artificial hip joint: Secondary | ICD-10-CM | POA: Diagnosis not present

## 2022-03-05 DIAGNOSIS — I10 Essential (primary) hypertension: Secondary | ICD-10-CM | POA: Diagnosis not present

## 2022-03-05 DIAGNOSIS — Z7982 Long term (current) use of aspirin: Secondary | ICD-10-CM | POA: Diagnosis not present

## 2022-03-05 DIAGNOSIS — Z4789 Encounter for other orthopedic aftercare: Secondary | ICD-10-CM | POA: Diagnosis not present

## 2022-03-05 DIAGNOSIS — E785 Hyperlipidemia, unspecified: Secondary | ICD-10-CM | POA: Diagnosis not present

## 2022-03-05 DIAGNOSIS — Z7901 Long term (current) use of anticoagulants: Secondary | ICD-10-CM | POA: Diagnosis not present

## 2022-03-26 DIAGNOSIS — G500 Trigeminal neuralgia: Secondary | ICD-10-CM | POA: Diagnosis not present

## 2022-03-26 DIAGNOSIS — M8700 Idiopathic aseptic necrosis of unspecified bone: Secondary | ICD-10-CM | POA: Diagnosis not present

## 2022-03-26 DIAGNOSIS — Z8269 Family history of other diseases of the musculoskeletal system and connective tissue: Secondary | ICD-10-CM | POA: Diagnosis not present

## 2022-03-26 DIAGNOSIS — Z79899 Other long term (current) drug therapy: Secondary | ICD-10-CM | POA: Diagnosis not present

## 2022-03-31 DIAGNOSIS — M879 Osteonecrosis, unspecified: Secondary | ICD-10-CM | POA: Diagnosis not present

## 2022-03-31 DIAGNOSIS — Z96641 Presence of right artificial hip joint: Secondary | ICD-10-CM | POA: Diagnosis not present

## 2022-03-31 DIAGNOSIS — S46011A Strain of muscle(s) and tendon(s) of the rotator cuff of right shoulder, initial encounter: Secondary | ICD-10-CM | POA: Diagnosis not present

## 2022-04-13 DIAGNOSIS — G500 Trigeminal neuralgia: Secondary | ICD-10-CM | POA: Diagnosis not present

## 2022-04-13 DIAGNOSIS — M810 Age-related osteoporosis without current pathological fracture: Secondary | ICD-10-CM | POA: Diagnosis not present

## 2022-04-13 DIAGNOSIS — M8700 Idiopathic aseptic necrosis of unspecified bone: Secondary | ICD-10-CM | POA: Diagnosis not present

## 2022-04-13 DIAGNOSIS — I10 Essential (primary) hypertension: Secondary | ICD-10-CM | POA: Diagnosis not present

## 2022-04-13 DIAGNOSIS — E7800 Pure hypercholesterolemia, unspecified: Secondary | ICD-10-CM | POA: Diagnosis not present

## 2022-04-15 DIAGNOSIS — M8700 Idiopathic aseptic necrosis of unspecified bone: Secondary | ICD-10-CM | POA: Diagnosis not present

## 2022-04-15 DIAGNOSIS — E7800 Pure hypercholesterolemia, unspecified: Secondary | ICD-10-CM | POA: Diagnosis not present

## 2022-04-15 DIAGNOSIS — I10 Essential (primary) hypertension: Secondary | ICD-10-CM | POA: Diagnosis not present

## 2022-04-15 DIAGNOSIS — G500 Trigeminal neuralgia: Secondary | ICD-10-CM | POA: Diagnosis not present

## 2022-04-15 DIAGNOSIS — M810 Age-related osteoporosis without current pathological fracture: Secondary | ICD-10-CM | POA: Diagnosis not present

## 2022-04-29 DIAGNOSIS — Z510 Encounter for antineoplastic radiation therapy: Secondary | ICD-10-CM | POA: Diagnosis not present

## 2022-04-29 DIAGNOSIS — G500 Trigeminal neuralgia: Secondary | ICD-10-CM | POA: Diagnosis not present

## 2022-05-04 DIAGNOSIS — R7989 Other specified abnormal findings of blood chemistry: Secondary | ICD-10-CM | POA: Diagnosis not present

## 2022-05-10 ENCOUNTER — Encounter: Payer: Self-pay | Admitting: Internal Medicine

## 2022-05-10 ENCOUNTER — Other Ambulatory Visit: Payer: Self-pay | Admitting: Internal Medicine

## 2022-05-10 DIAGNOSIS — R7989 Other specified abnormal findings of blood chemistry: Secondary | ICD-10-CM

## 2022-05-19 ENCOUNTER — Ambulatory Visit: Payer: PPO

## 2022-05-24 ENCOUNTER — Ambulatory Visit
Admission: RE | Admit: 2022-05-24 | Discharge: 2022-05-24 | Disposition: A | Discharge status: Home / Self Care | Payer: PPO | Source: Ambulatory Visit | Attending: Internal Medicine | Admitting: Internal Medicine

## 2022-05-24 DIAGNOSIS — R7989 Other specified abnormal findings of blood chemistry: Secondary | ICD-10-CM | POA: Diagnosis not present

## 2022-05-24 DIAGNOSIS — K7689 Other specified diseases of liver: Secondary | ICD-10-CM | POA: Diagnosis not present

## 2022-05-24 DIAGNOSIS — R945 Abnormal results of liver function studies: Secondary | ICD-10-CM | POA: Diagnosis not present

## 2022-06-22 DIAGNOSIS — Z79899 Other long term (current) drug therapy: Secondary | ICD-10-CM | POA: Diagnosis not present

## 2022-06-22 DIAGNOSIS — I6381 Other cerebral infarction due to occlusion or stenosis of small artery: Secondary | ICD-10-CM | POA: Diagnosis not present

## 2022-06-22 DIAGNOSIS — M8700 Idiopathic aseptic necrosis of unspecified bone: Secondary | ICD-10-CM | POA: Diagnosis not present

## 2022-06-22 DIAGNOSIS — G500 Trigeminal neuralgia: Secondary | ICD-10-CM | POA: Diagnosis not present

## 2022-06-22 DIAGNOSIS — Z923 Personal history of irradiation: Secondary | ICD-10-CM | POA: Diagnosis not present

## 2022-07-22 DIAGNOSIS — H43813 Vitreous degeneration, bilateral: Secondary | ICD-10-CM | POA: Diagnosis not present

## 2022-07-22 DIAGNOSIS — H3530 Unspecified macular degeneration: Secondary | ICD-10-CM | POA: Diagnosis not present

## 2022-07-22 DIAGNOSIS — Z961 Presence of intraocular lens: Secondary | ICD-10-CM | POA: Diagnosis not present

## 2022-08-23 DIAGNOSIS — G500 Trigeminal neuralgia: Secondary | ICD-10-CM | POA: Diagnosis not present

## 2022-08-23 DIAGNOSIS — Z923 Personal history of irradiation: Secondary | ICD-10-CM | POA: Diagnosis not present

## 2022-08-23 DIAGNOSIS — Z79899 Other long term (current) drug therapy: Secondary | ICD-10-CM | POA: Diagnosis not present

## 2022-08-23 DIAGNOSIS — M8700 Idiopathic aseptic necrosis of unspecified bone: Secondary | ICD-10-CM | POA: Diagnosis not present

## 2022-09-02 DIAGNOSIS — G500 Trigeminal neuralgia: Secondary | ICD-10-CM | POA: Diagnosis not present

## 2022-09-02 DIAGNOSIS — Z9889 Other specified postprocedural states: Secondary | ICD-10-CM | POA: Diagnosis not present

## 2022-09-02 DIAGNOSIS — Z79899 Other long term (current) drug therapy: Secondary | ICD-10-CM | POA: Diagnosis not present

## 2022-09-15 DIAGNOSIS — M47816 Spondylosis without myelopathy or radiculopathy, lumbar region: Secondary | ICD-10-CM | POA: Diagnosis not present

## 2022-09-15 DIAGNOSIS — M879 Osteonecrosis, unspecified: Secondary | ICD-10-CM | POA: Diagnosis not present

## 2022-09-15 DIAGNOSIS — Z96641 Presence of right artificial hip joint: Secondary | ICD-10-CM | POA: Diagnosis not present

## 2022-10-22 DIAGNOSIS — Z79899 Other long term (current) drug therapy: Secondary | ICD-10-CM | POA: Diagnosis not present

## 2022-10-22 DIAGNOSIS — M8700 Idiopathic aseptic necrosis of unspecified bone: Secondary | ICD-10-CM | POA: Diagnosis not present

## 2022-10-22 DIAGNOSIS — G500 Trigeminal neuralgia: Secondary | ICD-10-CM | POA: Diagnosis not present

## 2022-10-22 DIAGNOSIS — I6381 Other cerebral infarction due to occlusion or stenosis of small artery: Secondary | ICD-10-CM | POA: Diagnosis not present

## 2022-11-02 DIAGNOSIS — G500 Trigeminal neuralgia: Secondary | ICD-10-CM | POA: Diagnosis not present

## 2022-11-02 DIAGNOSIS — Z923 Personal history of irradiation: Secondary | ICD-10-CM | POA: Diagnosis not present

## 2022-11-09 DIAGNOSIS — Z1231 Encounter for screening mammogram for malignant neoplasm of breast: Secondary | ICD-10-CM | POA: Diagnosis not present

## 2022-11-09 DIAGNOSIS — E7800 Pure hypercholesterolemia, unspecified: Secondary | ICD-10-CM | POA: Diagnosis not present

## 2022-11-09 DIAGNOSIS — M25511 Pain in right shoulder: Secondary | ICD-10-CM | POA: Diagnosis not present

## 2022-11-09 DIAGNOSIS — M8700 Idiopathic aseptic necrosis of unspecified bone: Secondary | ICD-10-CM | POA: Diagnosis not present

## 2022-11-09 DIAGNOSIS — R8290 Unspecified abnormal findings in urine: Secondary | ICD-10-CM | POA: Diagnosis not present

## 2022-11-09 DIAGNOSIS — G8929 Other chronic pain: Secondary | ICD-10-CM | POA: Diagnosis not present

## 2022-11-09 DIAGNOSIS — I10 Essential (primary) hypertension: Secondary | ICD-10-CM | POA: Diagnosis not present

## 2022-11-09 DIAGNOSIS — M810 Age-related osteoporosis without current pathological fracture: Secondary | ICD-10-CM | POA: Diagnosis not present

## 2022-11-09 DIAGNOSIS — G500 Trigeminal neuralgia: Secondary | ICD-10-CM | POA: Diagnosis not present

## 2022-11-09 DIAGNOSIS — M8588 Other specified disorders of bone density and structure, other site: Secondary | ICD-10-CM | POA: Diagnosis not present

## 2022-11-09 DIAGNOSIS — M545 Low back pain, unspecified: Secondary | ICD-10-CM | POA: Diagnosis not present

## 2022-11-09 DIAGNOSIS — Z0000 Encounter for general adult medical examination without abnormal findings: Secondary | ICD-10-CM | POA: Diagnosis not present

## 2022-11-09 DIAGNOSIS — M5136 Other intervertebral disc degeneration, lumbar region: Secondary | ICD-10-CM | POA: Diagnosis not present

## 2022-11-11 ENCOUNTER — Other Ambulatory Visit: Payer: Self-pay | Admitting: Internal Medicine

## 2022-11-11 DIAGNOSIS — Z1231 Encounter for screening mammogram for malignant neoplasm of breast: Secondary | ICD-10-CM

## 2022-11-17 DIAGNOSIS — M47816 Spondylosis without myelopathy or radiculopathy, lumbar region: Secondary | ICD-10-CM | POA: Diagnosis not present

## 2022-11-17 DIAGNOSIS — M5136 Other intervertebral disc degeneration, lumbar region: Secondary | ICD-10-CM | POA: Diagnosis not present

## 2022-11-22 DIAGNOSIS — M545 Low back pain, unspecified: Secondary | ICD-10-CM | POA: Diagnosis not present

## 2022-11-23 DIAGNOSIS — Z79899 Other long term (current) drug therapy: Secondary | ICD-10-CM | POA: Diagnosis not present

## 2022-11-25 DIAGNOSIS — M545 Low back pain, unspecified: Secondary | ICD-10-CM | POA: Diagnosis not present

## 2022-12-01 DIAGNOSIS — M545 Low back pain, unspecified: Secondary | ICD-10-CM | POA: Diagnosis not present

## 2022-12-03 DIAGNOSIS — M545 Low back pain, unspecified: Secondary | ICD-10-CM | POA: Diagnosis not present

## 2022-12-07 DIAGNOSIS — M545 Low back pain, unspecified: Secondary | ICD-10-CM | POA: Diagnosis not present

## 2022-12-09 DIAGNOSIS — M545 Low back pain, unspecified: Secondary | ICD-10-CM | POA: Diagnosis not present

## 2022-12-13 DIAGNOSIS — M545 Low back pain, unspecified: Secondary | ICD-10-CM | POA: Diagnosis not present

## 2022-12-15 DIAGNOSIS — M545 Low back pain, unspecified: Secondary | ICD-10-CM | POA: Diagnosis not present

## 2022-12-20 DIAGNOSIS — M545 Low back pain, unspecified: Secondary | ICD-10-CM | POA: Diagnosis not present

## 2022-12-22 DIAGNOSIS — M545 Low back pain, unspecified: Secondary | ICD-10-CM | POA: Diagnosis not present

## 2022-12-31 DIAGNOSIS — M545 Low back pain, unspecified: Secondary | ICD-10-CM | POA: Diagnosis not present

## 2023-01-05 DIAGNOSIS — H3530 Unspecified macular degeneration: Secondary | ICD-10-CM | POA: Diagnosis not present

## 2023-01-05 DIAGNOSIS — Z961 Presence of intraocular lens: Secondary | ICD-10-CM | POA: Diagnosis not present

## 2023-01-05 DIAGNOSIS — H43813 Vitreous degeneration, bilateral: Secondary | ICD-10-CM | POA: Diagnosis not present

## 2023-01-05 DIAGNOSIS — H353132 Nonexudative age-related macular degeneration, bilateral, intermediate dry stage: Secondary | ICD-10-CM | POA: Diagnosis not present

## 2023-01-10 DIAGNOSIS — M545 Low back pain, unspecified: Secondary | ICD-10-CM | POA: Diagnosis not present

## 2023-01-11 ENCOUNTER — Other Ambulatory Visit: Payer: Self-pay | Admitting: Family Medicine

## 2023-01-11 DIAGNOSIS — M47816 Spondylosis without myelopathy or radiculopathy, lumbar region: Secondary | ICD-10-CM | POA: Diagnosis not present

## 2023-01-11 DIAGNOSIS — M5416 Radiculopathy, lumbar region: Secondary | ICD-10-CM

## 2023-01-11 DIAGNOSIS — M5136 Other intervertebral disc degeneration, lumbar region: Secondary | ICD-10-CM | POA: Diagnosis not present

## 2023-01-13 DIAGNOSIS — M545 Low back pain, unspecified: Secondary | ICD-10-CM | POA: Diagnosis not present

## 2023-01-20 DIAGNOSIS — M545 Low back pain, unspecified: Secondary | ICD-10-CM | POA: Diagnosis not present

## 2023-01-24 DIAGNOSIS — M545 Low back pain, unspecified: Secondary | ICD-10-CM | POA: Diagnosis not present

## 2023-01-27 DIAGNOSIS — Z510 Encounter for antineoplastic radiation therapy: Secondary | ICD-10-CM | POA: Diagnosis not present

## 2023-01-27 DIAGNOSIS — G500 Trigeminal neuralgia: Secondary | ICD-10-CM | POA: Diagnosis not present

## 2023-01-28 DIAGNOSIS — M545 Low back pain, unspecified: Secondary | ICD-10-CM | POA: Diagnosis not present

## 2023-01-31 ENCOUNTER — Ambulatory Visit
Admission: RE | Admit: 2023-01-31 | Discharge: 2023-01-31 | Disposition: A | Discharge status: Home / Self Care | Payer: PPO | Source: Ambulatory Visit | Attending: Family Medicine | Admitting: Family Medicine

## 2023-01-31 DIAGNOSIS — M419 Scoliosis, unspecified: Secondary | ICD-10-CM | POA: Diagnosis not present

## 2023-01-31 DIAGNOSIS — M48061 Spinal stenosis, lumbar region without neurogenic claudication: Secondary | ICD-10-CM | POA: Diagnosis not present

## 2023-01-31 DIAGNOSIS — M5416 Radiculopathy, lumbar region: Secondary | ICD-10-CM

## 2023-02-01 DIAGNOSIS — M545 Low back pain, unspecified: Secondary | ICD-10-CM | POA: Diagnosis not present

## 2023-02-02 DIAGNOSIS — R899 Unspecified abnormal finding in specimens from other organs, systems and tissues: Secondary | ICD-10-CM | POA: Diagnosis not present

## 2023-02-04 DIAGNOSIS — M545 Low back pain, unspecified: Secondary | ICD-10-CM | POA: Diagnosis not present

## 2023-02-08 DIAGNOSIS — M545 Low back pain, unspecified: Secondary | ICD-10-CM | POA: Diagnosis not present

## 2023-02-10 DIAGNOSIS — M545 Low back pain, unspecified: Secondary | ICD-10-CM | POA: Diagnosis not present

## 2023-02-14 DIAGNOSIS — M545 Low back pain, unspecified: Secondary | ICD-10-CM | POA: Diagnosis not present

## 2023-02-16 DIAGNOSIS — M545 Low back pain, unspecified: Secondary | ICD-10-CM | POA: Diagnosis not present

## 2023-03-15 DIAGNOSIS — M545 Low back pain, unspecified: Secondary | ICD-10-CM | POA: Diagnosis not present

## 2023-03-17 DIAGNOSIS — M545 Low back pain, unspecified: Secondary | ICD-10-CM | POA: Diagnosis not present

## 2023-03-22 DIAGNOSIS — M545 Low back pain, unspecified: Secondary | ICD-10-CM | POA: Diagnosis not present

## 2023-03-24 DIAGNOSIS — M7989 Other specified soft tissue disorders: Secondary | ICD-10-CM | POA: Diagnosis not present

## 2023-03-24 DIAGNOSIS — M85862 Other specified disorders of bone density and structure, left lower leg: Secondary | ICD-10-CM | POA: Diagnosis not present

## 2023-03-24 DIAGNOSIS — M1712 Unilateral primary osteoarthritis, left knee: Secondary | ICD-10-CM | POA: Diagnosis not present

## 2023-03-24 DIAGNOSIS — M545 Low back pain, unspecified: Secondary | ICD-10-CM | POA: Diagnosis not present

## 2023-03-24 DIAGNOSIS — M25462 Effusion, left knee: Secondary | ICD-10-CM | POA: Diagnosis not present

## 2023-03-28 DIAGNOSIS — M545 Low back pain, unspecified: Secondary | ICD-10-CM | POA: Diagnosis not present

## 2023-03-31 DIAGNOSIS — M545 Low back pain, unspecified: Secondary | ICD-10-CM | POA: Diagnosis not present

## 2023-04-07 DIAGNOSIS — M545 Low back pain, unspecified: Secondary | ICD-10-CM | POA: Diagnosis not present

## 2023-04-19 DIAGNOSIS — M545 Low back pain, unspecified: Secondary | ICD-10-CM | POA: Diagnosis not present

## 2023-04-21 DIAGNOSIS — M545 Low back pain, unspecified: Secondary | ICD-10-CM | POA: Diagnosis not present

## 2023-04-29 DIAGNOSIS — I10 Essential (primary) hypertension: Secondary | ICD-10-CM | POA: Diagnosis not present

## 2023-04-29 DIAGNOSIS — E7800 Pure hypercholesterolemia, unspecified: Secondary | ICD-10-CM | POA: Diagnosis not present

## 2023-04-29 DIAGNOSIS — J439 Emphysema, unspecified: Secondary | ICD-10-CM | POA: Diagnosis not present

## 2023-04-29 DIAGNOSIS — G500 Trigeminal neuralgia: Secondary | ICD-10-CM | POA: Diagnosis not present

## 2023-04-29 DIAGNOSIS — R0602 Shortness of breath: Secondary | ICD-10-CM | POA: Diagnosis not present

## 2023-04-29 DIAGNOSIS — I700 Atherosclerosis of aorta: Secondary | ICD-10-CM | POA: Diagnosis not present

## 2023-05-03 ENCOUNTER — Other Ambulatory Visit: Payer: Self-pay | Admitting: Internal Medicine

## 2023-05-03 DIAGNOSIS — R7989 Other specified abnormal findings of blood chemistry: Secondary | ICD-10-CM

## 2023-05-03 DIAGNOSIS — R0602 Shortness of breath: Secondary | ICD-10-CM

## 2023-05-04 ENCOUNTER — Ambulatory Visit
Admission: RE | Admit: 2023-05-04 | Discharge: 2023-05-04 | Disposition: A | Discharge status: Home / Self Care | Payer: PPO | Source: Ambulatory Visit | Attending: Internal Medicine | Admitting: Internal Medicine

## 2023-05-04 DIAGNOSIS — R918 Other nonspecific abnormal finding of lung field: Secondary | ICD-10-CM | POA: Diagnosis not present

## 2023-05-04 DIAGNOSIS — R0602 Shortness of breath: Secondary | ICD-10-CM | POA: Diagnosis not present

## 2023-05-04 DIAGNOSIS — R7989 Other specified abnormal findings of blood chemistry: Secondary | ICD-10-CM | POA: Diagnosis not present

## 2023-05-04 DIAGNOSIS — I2510 Atherosclerotic heart disease of native coronary artery without angina pectoris: Secondary | ICD-10-CM | POA: Diagnosis not present

## 2023-05-04 DIAGNOSIS — J439 Emphysema, unspecified: Secondary | ICD-10-CM | POA: Diagnosis not present

## 2023-05-04 DIAGNOSIS — R935 Abnormal findings on diagnostic imaging of other abdominal regions, including retroperitoneum: Secondary | ICD-10-CM | POA: Diagnosis not present

## 2023-05-04 MED ORDER — IOHEXOL 350 MG/ML SOLN
75.0000 mL | Freq: Once | INTRAVENOUS | Status: AC | PRN
  Administered 2023-05-04: 75 mL via INTRAVENOUS

## 2023-05-05 DIAGNOSIS — M25462 Effusion, left knee: Secondary | ICD-10-CM | POA: Diagnosis not present

## 2023-05-24 DIAGNOSIS — I4891 Unspecified atrial fibrillation: Secondary | ICD-10-CM | POA: Diagnosis not present

## 2023-05-24 DIAGNOSIS — I480 Paroxysmal atrial fibrillation: Secondary | ICD-10-CM | POA: Diagnosis not present

## 2023-05-24 DIAGNOSIS — I10 Essential (primary) hypertension: Secondary | ICD-10-CM | POA: Diagnosis not present

## 2023-05-24 DIAGNOSIS — R0602 Shortness of breath: Secondary | ICD-10-CM | POA: Diagnosis not present

## 2023-05-24 DIAGNOSIS — M8700 Idiopathic aseptic necrosis of unspecified bone: Secondary | ICD-10-CM | POA: Diagnosis not present

## 2023-05-24 DIAGNOSIS — E7800 Pure hypercholesterolemia, unspecified: Secondary | ICD-10-CM | POA: Diagnosis not present

## 2023-05-24 DIAGNOSIS — R000 Tachycardia, unspecified: Secondary | ICD-10-CM | POA: Diagnosis not present

## 2023-05-26 ENCOUNTER — Encounter
Admission: RE | Disposition: A | Discharge status: Home / Self Care | Payer: Self-pay | Source: Ambulatory Visit | Attending: Cardiology

## 2023-05-26 ENCOUNTER — Ambulatory Visit
Admission: RE | Admit: 2023-05-26 | Discharge: 2023-05-26 | Disposition: A | Discharge status: Home / Self Care | Payer: PPO | Source: Ambulatory Visit | Attending: Cardiology | Admitting: Cardiology

## 2023-05-26 ENCOUNTER — Encounter: Payer: Self-pay | Admitting: Anesthesiology

## 2023-05-26 DIAGNOSIS — I4819 Other persistent atrial fibrillation: Secondary | ICD-10-CM | POA: Insufficient documentation

## 2023-05-26 DIAGNOSIS — Z539 Procedure and treatment not carried out, unspecified reason: Secondary | ICD-10-CM | POA: Insufficient documentation

## 2023-05-26 HISTORY — PX: TEE WITHOUT CARDIOVERSION: SHX5443

## 2023-05-26 HISTORY — PX: CARDIOVERSION: SHX1299

## 2023-05-26 SURGERY — CARDIOVERSION
Anesthesia: General

## 2023-05-26 MED ORDER — SODIUM CHLORIDE 0.9 % IV SOLN
INTRAVENOUS | Status: DC
Start: 2023-05-26 — End: 2023-05-26

## 2023-05-26 NOTE — Anesthesia Preprocedure Evaluation (Addendum)
Anesthesia Evaluation  Patient identified by MRN, date of birth, ID band Patient awake    Reviewed: Allergy & Precautions, NPO status , Patient's Chart, lab work & pertinent test results  History of Anesthesia Complications Negative for: history of anesthetic complications  Airway Mallampati: III  TM Distance: >3 FB Neck ROM: full    Dental no notable dental hx.    Pulmonary COPD,  COPD inhaler, Patient abstained from smoking.   Pulmonary exam normal        Cardiovascular hypertension, + dysrhythmias Atrial Fibrillation      Neuro/Psych  Neuromuscular disease  negative psych ROS   GI/Hepatic negative GI ROS, Neg liver ROS,,,  Endo/Other  negative endocrine ROS    Renal/GU negative Renal ROS  negative genitourinary   Musculoskeletal   Abdominal   Peds  Hematology negative hematology ROS (+)   Anesthesia Other Findings Past Medical History: 2013: Arthritis     Comment:  rt hip No date: Dyspnea No date: HLD (hyperlipidemia) No date: Hypertension No date: Tobacco dependence No date: Trigeminal neuralgia  Past Surgical History: 1985: APPENDECTOMY No date: CATARACT EXTRACTION; Bilateral 09/15/2021: COLONOSCOPY WITH PROPOFOL; N/A     Comment:  Procedure: COLONOSCOPY WITH PROPOFOL;  Surgeon:               Regis Bill, MD;  Location: [facility] END[facility];                Service: Endoscopy;  Laterality: N/A; 11/2015: EYE SURGERY; Bilateral     Comment:  [place] 11/17/2021: TOTAL HIP ARTHROPLASTY; Left     Comment:  Procedure: TOTAL HIP ARTHROPLASTY ANTERIOR APPROACH;                Surgeon: Kennedy Bucker, MD;  Location: [facility] ORS;                Service: Orthopedics;  Laterality: Left; 02/16/2022: TOTAL HIP ARTHROPLASTY; Right     Comment:  Procedure: TOTAL HIP ARTHROPLASTY ANTERIOR APPROACH;                Surgeon: Kennedy Bucker, MD;  Location: [facility] ORS;                Service: Orthopedics;  Laterality:  Right;     Reproductive/Obstetrics negative OB ROS                             Anesthesia Physical Anesthesia Plan  ASA: 3  Anesthesia Plan: General   Post-op Pain Management: Minimal or no pain anticipated   Induction: Intravenous  PONV Risk Score and Plan: 2 and Propofol infusion and TIVA  Airway Management Planned: Natural Airway and Nasal Cannula  Additional Equipment:   Intra-op Plan:   Post-operative Plan:   Informed Consent: I have reviewed the patients History and Physical, chart, labs and discussed the procedure including the risks, benefits and alternatives for the proposed anesthesia with the patient or authorized representative who has indicated his/her understanding and acceptance.     Dental Advisory Given  Plan Discussed with: Anesthesiologist, CRNA and Surgeon  Anesthesia Plan Comments: (Patient drove herself here and does not have a ride home. Procedure cancelled in preop)       Anesthesia Quick Evaluation

## 2023-05-27 ENCOUNTER — Encounter: Payer: Self-pay | Admitting: Cardiology

## 2023-05-30 ENCOUNTER — Encounter: Admission: RE | Payer: Self-pay | Source: Ambulatory Visit

## 2023-05-30 ENCOUNTER — Ambulatory Visit: Admission: RE | Admit: 2023-05-30 | Payer: PPO | Source: Ambulatory Visit | Admitting: Cardiology

## 2023-05-30 SURGERY — TRANSESOPHAGEAL ECHOCARDIOGRAM (TEE)
Anesthesia: General

## 2023-05-31 ENCOUNTER — Other Ambulatory Visit: Payer: Self-pay

## 2023-05-31 ENCOUNTER — Encounter: Payer: Self-pay | Admitting: Emergency Medicine

## 2023-05-31 ENCOUNTER — Emergency Department
Admission: EM | Admit: 2023-05-31 | Discharge: 2023-05-31 | Disposition: A | Discharge status: Home / Self Care | Payer: PPO | Attending: Emergency Medicine | Admitting: Emergency Medicine

## 2023-05-31 DIAGNOSIS — I4892 Unspecified atrial flutter: Secondary | ICD-10-CM | POA: Diagnosis not present

## 2023-05-31 DIAGNOSIS — I480 Paroxysmal atrial fibrillation: Secondary | ICD-10-CM | POA: Insufficient documentation

## 2023-05-31 DIAGNOSIS — R55 Syncope and collapse: Secondary | ICD-10-CM | POA: Diagnosis present

## 2023-05-31 DIAGNOSIS — R000 Tachycardia, unspecified: Secondary | ICD-10-CM | POA: Diagnosis not present

## 2023-05-31 DIAGNOSIS — I483 Typical atrial flutter: Secondary | ICD-10-CM | POA: Insufficient documentation

## 2023-05-31 LAB — CBC
HCT: 44.7 % (ref 36.0–46.0)
Hemoglobin: 14.8 g/dL (ref 12.0–15.0)
MCH: 31.9 pg (ref 26.0–34.0)
MCHC: 33.1 g/dL (ref 30.0–36.0)
MCV: 96.3 fL (ref 80.0–100.0)
Platelets: 253 10*3/uL (ref 150–400)
RBC: 4.64 MIL/uL (ref 3.87–5.11)
RDW: 14.6 % (ref 11.5–15.5)
WBC: 9.1 10*3/uL (ref 4.0–10.5)
nRBC: 0 % (ref 0.0–0.2)

## 2023-05-31 LAB — BASIC METABOLIC PANEL
Anion gap: 9 (ref 5–15)
BUN: 29 mg/dL — ABNORMAL HIGH (ref 8–23)
CO2: 22 mmol/L (ref 22–32)
Calcium: 9.9 mg/dL (ref 8.9–10.3)
Chloride: 105 mmol/L (ref 98–111)
Creatinine, Ser: 0.66 mg/dL (ref 0.44–1.00)
GFR, Estimated: >60 mL/min (ref >60)
Glucose, Bld: 131 mg/dL — ABNORMAL HIGH (ref 70–99)
Potassium: 4 mmol/L (ref 3.5–5.1)
Sodium: 136 mmol/L (ref 135–145)

## 2023-05-31 MED ORDER — METOPROLOL TARTRATE 75 MG PO TABS: 75.0000 mg | ORAL_TABLET | Freq: Two times a day (BID) | ORAL | 0 refills | Status: AC

## 2023-05-31 NOTE — ED Provider Notes (Signed)
[facility] Provider Note   Event Date/Time   First MD Initiated Contact with Patient 05/31/23 1459     (approximate) History  Atrial Fibrillation  HPI Sarah Cunningham is a 77 y.o. female with a past medical history of paroxysmal atrial fibrillation/flutter who presents complaining of 2 episodes of presyncope today as well as mild tachycardia.  Patient states that she was told to come to the emergency department if her heart rate got above 110 and has been as high as 120 today.  Patient states she took her medication on time and as prescribed.  Patient denies any chest pain during these events and only describes palpitations with some lightheadedness. ROS: Patient currently denies any vision changes, tinnitus, difficulty speaking, facial droop, sore throat, shortness of breath, abdominal pain, nausea/vomiting/diarrhea, dysuria, or weakness/numbness/paresthesias in any extremity   Physical Exam  Triage Vital Signs: ED Triage Vitals  Encounter Vitals Group     BP 05/31/23 1411 (!) 143/105     Systolic BP Percentile --      Diastolic BP Percentile --      Pulse Rate 05/31/23 1411 (!) 105     Resp 05/31/23 1411 20     Temp 05/31/23 1411 98.4 F (36.9 C)     Temp Source 05/31/23 1411 Oral     SpO2 05/31/23 1411 94 %     Weight 05/31/23 1410 120 lb (54.4 kg)     Height 05/31/23 1410 5\' 6"  (1.676 m)     Head Circumference --      Peak Flow --      Pain Score 05/31/23 1410 0     Pain Loc --      Pain Education --      Exclude from Growth Chart --    Most recent vital signs: Vitals:   05/31/23 1411 05/31/23 1543  BP: (!) 143/105 (!) 127/98  Pulse: (!) 105 100  Resp: 20 18  Temp: 98.4 F (36.9 C) 97.9 F (36.6 C)  SpO2: 94% 97%   General: Awake, oriented x4. CV:  Good peripheral perfusion.  Resp:  Normal effort.  Abd:  No distention.  Other:  Elderly well-developed, well-nourished Caucasian female resting comfortably in no acute distress ED Results  / Procedures / Treatments  Labs (all labs ordered are listed, but only abnormal results are displayed) Labs Reviewed  BASIC METABOLIC PANEL - Abnormal; Notable for the following components:      Result Value   Glucose, Bld 131 (*)    BUN 29 (*)    All other components within normal limits  CBC   EKG ED ECG REPORT I, Merwyn Katos, the attending physician, personally viewed and interpreted this ECG. Date: 05/31/2023 EKG Time: 1414 Rate: 94 Rhythm: Atrial flutter with variable AV block QRS Axis: normal Intervals: normal ST/T Wave abnormalities: normal Narrative Interpretation: Rate controlled atrial flutter with variable AV block.  No evidence of acute ischemia PROCEDURES: Critical Care performed: No .1-3 Lead EKG Interpretation  Performed by: Merwyn Katos, MD Authorized by: Merwyn Katos, MD     Interpretation: abnormal     ECG rate:  92   ECG rate assessment: normal     Rhythm: atrial flutter     Ectopy: none     Conduction: normal    MEDICATIONS ORDERED IN ED: Medications - No data to display IMPRESSION / MDM / ASSESSMENT AND PLAN / ED COURSE  I reviewed the triage vital signs and the nursing notes.  The patient is on the cardiac monitor to evaluate for evidence of arrhythmia and/or significant heart rate changes. Patient's presentation is most consistent with acute presentation with potential threat to life or bodily function. + atrial flutter, rate is controlled DDx: Pneumothorax, Pneumonia, Pulmonary Embolus, Tamponade, ACS, Thyrotoxicosis.  No history or evidence decompensated heart failure. Given their history and exam it is likely this patient is unlikely to spontaneously revert to a rate controlled rhythm and necessitates a thorough workup for their arrhythmia. Workup: ECG, CXR, CBC, BMP, UA, Troponin, BNP, TSH, Ca-Mag-Phos Interventions: Defer Cardioversion (uncertain historical reliability with time of onset, increased risk of  thromboembolic stroke).  Patient scheduled for cardioversion on 12/2.  Will increase metoprolol from 50 mg to 75 mg twice daily until follow-up on the second  Disposition: Admit   FINAL CLINICAL IMPRESSION(S) / ED DIAGNOSES   Final diagnoses:  Typical atrial flutter (HCC)   Rx / DC Orders   ED Discharge Orders          Ordered    metoprolol tartrate 75 MG TABS  2 times daily        05/31/23 1532           Note:  This document was prepared using Dragon voice recognition software and may include unintentional dictation errors.   Merwyn Katos, MD 05/31/23 [phone]

## 2023-05-31 NOTE — ED Triage Notes (Signed)
Pt via POV from home. Pt was scheduled for a cardioversion last week but had transportation issues reports that she has one scheduled for Monday but cardiology states that if it was higher than 110 to come to the ER. Pt does take Eliquis, reports that today she had 2 syncopal episodes today. On arrival, pt is HR a flutter at a rate of 98. Pt is A&Ox4 and NAD

## 2023-06-03 ENCOUNTER — Emergency Department
Admission: EM | Admit: 2023-06-03 | Discharge: 2023-06-03 | Disposition: A | Discharge status: Home / Self Care | Payer: PPO | Attending: Emergency Medicine | Admitting: Emergency Medicine

## 2023-06-03 ENCOUNTER — Other Ambulatory Visit: Payer: Self-pay

## 2023-06-03 ENCOUNTER — Emergency Department: Payer: PPO

## 2023-06-03 DIAGNOSIS — R7989 Other specified abnormal findings of blood chemistry: Secondary | ICD-10-CM | POA: Insufficient documentation

## 2023-06-03 DIAGNOSIS — J441 Chronic obstructive pulmonary disease with (acute) exacerbation: Secondary | ICD-10-CM | POA: Insufficient documentation

## 2023-06-03 DIAGNOSIS — Z20822 Contact with and (suspected) exposure to covid-19: Secondary | ICD-10-CM | POA: Insufficient documentation

## 2023-06-03 DIAGNOSIS — I480 Paroxysmal atrial fibrillation: Secondary | ICD-10-CM | POA: Insufficient documentation

## 2023-06-03 DIAGNOSIS — R778 Other specified abnormalities of plasma proteins: Secondary | ICD-10-CM | POA: Diagnosis not present

## 2023-06-03 DIAGNOSIS — I10 Essential (primary) hypertension: Secondary | ICD-10-CM | POA: Diagnosis not present

## 2023-06-03 DIAGNOSIS — R0689 Other abnormalities of breathing: Secondary | ICD-10-CM | POA: Diagnosis not present

## 2023-06-03 DIAGNOSIS — R069 Unspecified abnormalities of breathing: Secondary | ICD-10-CM | POA: Diagnosis not present

## 2023-06-03 DIAGNOSIS — R0602 Shortness of breath: Secondary | ICD-10-CM | POA: Diagnosis not present

## 2023-06-03 DIAGNOSIS — R000 Tachycardia, unspecified: Secondary | ICD-10-CM | POA: Diagnosis not present

## 2023-06-03 DIAGNOSIS — R918 Other nonspecific abnormal finding of lung field: Secondary | ICD-10-CM | POA: Diagnosis not present

## 2023-06-03 HISTORY — DX: Chronic obstructive pulmonary disease, unspecified: J44.9

## 2023-06-03 HISTORY — DX: Unspecified atrial fibrillation: I48.91

## 2023-06-03 HISTORY — DX: Essential (primary) hypertension: I10

## 2023-06-03 LAB — COMPREHENSIVE METABOLIC PANEL
ALT: 32 U/L (ref 0–44)
AST: 33 U/L (ref 15–41)
Albumin: 4.3 g/dL (ref 3.5–5.0)
Alkaline Phosphatase: 125 U/L (ref 38–126)
Anion gap: 10 (ref 5–15)
BUN: 41 mg/dL — ABNORMAL HIGH (ref 8–23)
CO2: 25 mmol/L (ref 22–32)
Calcium: 10 mg/dL (ref 8.9–10.3)
Chloride: 99 mmol/L (ref 98–111)
Creatinine, Ser: 0.94 mg/dL (ref 0.44–1.00)
GFR, Estimated: >60 mL/min (ref >60)
Glucose, Bld: 134 mg/dL — ABNORMAL HIGH (ref 70–99)
Potassium: 4.2 mmol/L (ref 3.5–5.1)
Sodium: 134 mmol/L — ABNORMAL LOW (ref 135–145)
Total Bilirubin: 1.1 mg/dL (ref <1.2)
Total Protein: 7.2 g/dL (ref 6.5–8.1)

## 2023-06-03 LAB — CBC WITH DIFFERENTIAL/PLATELET
Abs Immature Granulocytes: 0.15 10*3/uL — ABNORMAL HIGH (ref 0.00–0.07)
Basophils Absolute: 0.1 10*3/uL (ref 0.0–0.1)
Basophils Relative: 0 %
Eosinophils Absolute: 0 10*3/uL (ref 0.0–0.5)
Eosinophils Relative: 0 %
HCT: 43.1 % (ref 36.0–46.0)
Hemoglobin: 14.2 g/dL (ref 12.0–15.0)
Immature Granulocytes: 1 %
Lymphocytes Relative: 8 %
Lymphs Abs: 1.3 10*3/uL (ref 0.7–4.0)
MCH: 32.4 pg (ref 26.0–34.0)
MCHC: 32.9 g/dL (ref 30.0–36.0)
MCV: 98.4 fL (ref 80.0–100.0)
Monocytes Absolute: 1.6 10*3/uL — ABNORMAL HIGH (ref 0.1–1.0)
Monocytes Relative: 10 %
Neutro Abs: 12.9 10*3/uL — ABNORMAL HIGH (ref 1.7–7.7)
Neutrophils Relative %: 81 %
Platelets: 226 10*3/uL (ref 150–400)
RBC: 4.38 MIL/uL (ref 3.87–5.11)
RDW: 14.8 % (ref 11.5–15.5)
WBC: 16.1 10*3/uL — ABNORMAL HIGH (ref 4.0–10.5)
nRBC: 0 % (ref 0.0–0.2)

## 2023-06-03 LAB — BRAIN NATRIURETIC PEPTIDE: B Natriuretic Peptide: 400.6 pg/mL — ABNORMAL HIGH (ref 0.0–100.0)

## 2023-06-03 LAB — RESP PANEL BY RT-PCR (RSV, FLU A&B, COVID)  RVPGX2
Influenza A by PCR: NEGATIVE
Influenza B by PCR: NEGATIVE
Resp Syncytial Virus by PCR: NEGATIVE
SARS Coronavirus 2 by RT PCR: NEGATIVE

## 2023-06-03 LAB — TROPONIN I (HIGH SENSITIVITY)
Troponin I (High Sensitivity): 107 ng/L (ref <18)
Troponin I (High Sensitivity): 94 ng/L — ABNORMAL HIGH (ref <18)

## 2023-06-03 MED ORDER — ALBUTEROL SULFATE HFA 108 (90 BASE) MCG/ACT IN AERS: 2.0000 | INHALATION_SPRAY | RESPIRATORY_TRACT | 0 refills | Status: AC | PRN

## 2023-06-03 MED ORDER — AMOXICILLIN-POT CLAVULANATE 875-125 MG PO TABS: 1.0000 | ORAL_TABLET | Freq: Two times a day (BID) | ORAL | 0 refills | Status: AC

## 2023-06-03 MED ORDER — ALBUTEROL SULFATE (2.5 MG/3ML) 0.083% IN NEBU
5.0000 mg | INHALATION_SOLUTION | Freq: Once | RESPIRATORY_TRACT | Status: AC
Start: 2023-06-03 — End: 2023-06-03
  Administered 2023-06-03: 5 mg via RESPIRATORY_TRACT
  Filled 2023-06-03: qty 6

## 2023-06-03 MED ORDER — IPRATROPIUM-ALBUTEROL 0.5-2.5 (3) MG/3ML IN SOLN
3.0000 mL | Freq: Once | RESPIRATORY_TRACT | Status: AC
  Administered 2023-06-03: 3 mL via RESPIRATORY_TRACT
  Filled 2023-06-03: qty 3

## 2023-06-03 MED ORDER — METHYLPREDNISOLONE SODIUM SUCC 125 MG IJ SOLR
80.0000 mg | INTRAMUSCULAR | Status: AC
  Administered 2023-06-03: 80 mg via INTRAVENOUS
  Filled 2023-06-03: qty 2

## 2023-06-03 MED ORDER — PREDNISONE 20 MG PO TABS: 40.0000 mg | ORAL_TABLET | Freq: Every day | ORAL | 0 refills | Status: AC

## 2023-06-03 NOTE — ED Triage Notes (Signed)
Patient reports shortness of breath and productive cough for 2-3 days.  84-85% room air

## 2023-06-03 NOTE — ED Provider Notes (Signed)
[facility] Provider Note    Event Date/Time   First MD Initiated Contact with Patient 06/03/23 1030     (approximate)   History   Chief Complaint: Shortness of Breath   HPI  Sarah Cunningham is a 77 y.o. female with a history of COPD, recently diagnosed atrial fibrillation who comes the ED complaining of gradual onset of shortness of breath with productive cough for the past 3 days.  On arrival found to have room air oxygen saturation of 84%, placed on 3 L nasal cannula.  Patient denies chest pain.  She has dyspnea on exertion but no exertional chest pain.  Reviewing outside records, patient was recently referred to cardiology by PCP due to new onset of atrial fibrillation as well as dyspnea on exertion.  This was attributed to symptomatic A-fib, patient was started on anticoagulation and metoprolol and planned for outpatient ECV plus TEE.  Patient reports her appointment for this is Monday, 3 days from now.         Physical Exam   Triage Vital Signs: ED Triage Vitals  Encounter Vitals Group     BP 06/03/23 1034 (!) 148/87     Systolic BP Percentile --      Diastolic BP Percentile --      Pulse Rate 06/03/23 1034 61     Resp 06/03/23 1034 20     Temp 06/03/23 1039 97.6 F (36.4 C)     Temp Source 06/03/23 1039 Oral     SpO2 06/03/23 1034 100 %     Weight 06/03/23 1031 120 lb (54.4 kg)     Height 06/03/23 1031 5\' 6"  (1.676 m)     Head Circumference --      Peak Flow --      Pain Score 06/03/23 1035 0     Pain Loc --      Pain Education --      Exclude from Growth Chart --     Most recent vital signs: Vitals:   06/03/23 1256 06/03/23 1300  BP: 134/78 133/83  Pulse: (!) 54 (!) 52  Resp: 20 20  Temp:    SpO2: (!) 88% 91%    General: Awake, no distress.  CV:  Good peripheral perfusion.  Regular rhythm, rate 55 Resp:  Normal effort.  Bibasilar crackles.  Quiet breath sounds diffusely, prolonged expiratory phase Abd:  No distention.   Soft nontender Other:  Moist oral mucosa.  No lower extremity edema   ED Results / Procedures / Treatments   Labs (all labs ordered are listed, but only abnormal results are displayed) Labs Reviewed  BRAIN NATRIURETIC PEPTIDE - Abnormal; Notable for the following components:      Result Value   B Natriuretic Peptide 400.6 (*)    All other components within normal limits  CBC WITH DIFFERENTIAL/PLATELET - Abnormal; Notable for the following components:   WBC 16.1 (*)    Neutro Abs 12.9 (*)    Monocytes Absolute 1.6 (*)    Abs Immature Granulocytes 0.15 (*)    All other components within normal limits  COMPREHENSIVE METABOLIC PANEL - Abnormal; Notable for the following components:   Sodium 134 (*)    Glucose, Bld 134 (*)    BUN 41 (*)    All other components within normal limits  TROPONIN I (HIGH SENSITIVITY) - Abnormal; Notable for the following components:   Troponin I (High Sensitivity) 107 (*)    All other components within normal limits  TROPONIN  I (HIGH SENSITIVITY) - Abnormal; Notable for the following components:   Troponin I (High Sensitivity) 94 (*)    All other components within normal limits  RESP PANEL BY RT-PCR (RSV, FLU A&B, COVID)  [id number]     EKG Sinus rhythm, rate of 62.  Right bundle branch block.  First-degree AV block.  No acute ischemic changes   RADIOLOGY Chest x-ray interpreted by me, no focal consolidation.  Trace bilateral pleural effusion.  Radiology report reviewed   PROCEDURES:  Procedures   MEDICATIONS ORDERED IN ED: Medications  ipratropium-albuterol (DUONEB) 0.5-2.5 (3) MG/3ML nebulizer solution 3 mL (3 mLs Nebulization Given 06/03/23 1050)  methylPREDNISolone sodium succinate (SOLU-MEDROL) 125 mg/2 mL injection 80 mg (80 mg Intravenous Given 06/03/23 1055)  albuterol (PROVENTIL) (2.5 MG/3ML) 0.083% nebulizer solution 5 mg (5 mg Nebulization Given 06/03/23 1050)     IMPRESSION / MDM / ASSESSMENT AND PLAN / ED COURSE  I reviewed the  triage vital signs and the nursing notes.  DDx: COPD exacerbation, non-STEMI, CHF,, COVID, influenza, electrolyte abnormality, AKI  Patient's presentation is most consistent with acute presentation with potential threat to life or bodily function.  Patient presents with shortness of breath and productive cough.  Vital signs unremarkable except for initial hypoxia.  Not septic.  Will give steroids, bronchodilators, check chest x-ray and labs.  She is in sinus rhythm   Clinical Course as of 06/03/23 1417  Fri Jun 03, 2023  1317 Pt feeling much better. 94% on RA. Persistent basilar crackles but improved air movement. Initial trop 107. Presentation c/w COPD exac. Pt prefers to be discharged and avoid hospitalization if possible. D/w Dr. Juliann Pares who agrees with outpt follow up if delta trop is flat. [PS]    Clinical Course User Index [PS] Sharman Cheek, MD    ----------------------------------------- 2:15 PM on 06/03/2023 ----------------------------------------- Delta Trop flat.  Patient feeling well, stable for discharge.   FINAL CLINICAL IMPRESSION(S) / ED DIAGNOSES   Final diagnoses:  COPD exacerbation (HCC)  PAF (paroxysmal atrial fibrillation) (HCC)  Elevated troponin     Rx / DC Orders   ED Discharge Orders          Ordered    Ambulatory referral to Cardiology       Comments: If you have not heard from the Cardiology office within the next 72 hours please call [phone].   06/03/23 1412    amoxicillin-clavulanate (AUGMENTIN) 875-125 MG tablet  2 times daily        06/03/23 1412    predniSONE (DELTASONE) 20 MG tablet  Daily with breakfast        06/03/23 1412    albuterol (PROVENTIL HFA) 108 (90 Base) MCG/ACT inhaler  Every 4 hours PRN        06/03/23 1412             Note:  This document was prepared using Dragon voice recognition software and may include unintentional dictation errors.   Sharman Cheek, MD 06/03/23 [phone]

## 2023-06-03 NOTE — Discharge Instructions (Signed)
Your lab tests were reassuring today.  Your heart is in a normal rhythm.  Continue your medications at home as previously prescribed.  Take prednisone for the next 4 days along with albuterol as needed to help control COPD exacerbation.  Take Augmentin as prescribed to make sure you do not develop pneumonia.

## 2023-06-06 ENCOUNTER — Ambulatory Visit: Admission: RE | Admit: 2023-06-06 | Payer: PPO | Source: Ambulatory Visit | Admitting: Cardiology

## 2023-06-06 ENCOUNTER — Encounter
Admission: RE | Disposition: A | Discharge status: Home / Self Care | Payer: Self-pay | Source: Home / Self Care | Attending: Cardiology

## 2023-06-06 ENCOUNTER — Ambulatory Visit
Admission: RE | Admit: 2023-06-06 | Discharge: 2023-06-06 | Disposition: A | Discharge status: Home / Self Care | Payer: PPO | Source: Home / Self Care | Attending: Cardiology | Admitting: Cardiology

## 2023-06-06 ENCOUNTER — Ambulatory Visit: Payer: PPO | Admitting: Certified Registered"

## 2023-06-06 ENCOUNTER — Encounter: Payer: Self-pay | Admitting: Anesthesiology

## 2023-06-06 ENCOUNTER — Other Ambulatory Visit: Payer: Self-pay

## 2023-06-06 ENCOUNTER — Ambulatory Visit
Admission: RE | Admit: 2023-06-06 | Discharge: 2023-06-06 | Disposition: A | Discharge status: Home / Self Care | Payer: PPO | Attending: Cardiology | Admitting: Cardiology

## 2023-06-06 ENCOUNTER — Encounter: Payer: Self-pay | Admitting: Cardiology

## 2023-06-06 DIAGNOSIS — R0600 Dyspnea, unspecified: Secondary | ICD-10-CM | POA: Insufficient documentation

## 2023-06-06 DIAGNOSIS — I340 Nonrheumatic mitral (valve) insufficiency: Secondary | ICD-10-CM | POA: Insufficient documentation

## 2023-06-06 DIAGNOSIS — F17200 Nicotine dependence, unspecified, uncomplicated: Secondary | ICD-10-CM | POA: Insufficient documentation

## 2023-06-06 DIAGNOSIS — E785 Hyperlipidemia, unspecified: Secondary | ICD-10-CM | POA: Diagnosis not present

## 2023-06-06 DIAGNOSIS — G500 Trigeminal neuralgia: Secondary | ICD-10-CM | POA: Diagnosis not present

## 2023-06-06 DIAGNOSIS — I10 Essential (primary) hypertension: Secondary | ICD-10-CM | POA: Insufficient documentation

## 2023-06-06 DIAGNOSIS — R9431 Abnormal electrocardiogram [ECG] [EKG]: Secondary | ICD-10-CM | POA: Diagnosis not present

## 2023-06-06 DIAGNOSIS — I2510 Atherosclerotic heart disease of native coronary artery without angina pectoris: Secondary | ICD-10-CM | POA: Insufficient documentation

## 2023-06-06 DIAGNOSIS — I4891 Unspecified atrial fibrillation: Secondary | ICD-10-CM | POA: Diagnosis not present

## 2023-06-06 DIAGNOSIS — Z01810 Encounter for preprocedural cardiovascular examination: Secondary | ICD-10-CM | POA: Diagnosis not present

## 2023-06-06 DIAGNOSIS — J449 Chronic obstructive pulmonary disease, unspecified: Secondary | ICD-10-CM | POA: Diagnosis not present

## 2023-06-06 DIAGNOSIS — F17210 Nicotine dependence, cigarettes, uncomplicated: Secondary | ICD-10-CM | POA: Diagnosis not present

## 2023-06-06 HISTORY — PX: CARDIOVERSION: SHX1299

## 2023-06-06 HISTORY — PX: TEE WITHOUT CARDIOVERSION: SHX5443

## 2023-06-06 SURGERY — TRANSESOPHAGEAL ECHOCARDIOGRAM (TEE)
Anesthesia: General

## 2023-06-06 MED ORDER — BUTAMBEN-TETRACAINE-BENZOCAINE 2-2-14 % EX AERO
INHALATION_SPRAY | CUTANEOUS | Status: AC
  Filled 2023-06-06: qty 5

## 2023-06-06 MED ORDER — LIDOCAINE VISCOUS HCL 2 % MT SOLN
OROMUCOSAL | Status: AC
  Filled 2023-06-06: qty 15

## 2023-06-06 MED ORDER — SODIUM CHLORIDE 0.9 % IV SOLN: INTRAVENOUS | Status: DC | PRN

## 2023-06-06 MED ORDER — PROPOFOL 1000 MG/100ML IV EMUL
INTRAVENOUS | Status: AC
  Filled 2023-06-06: qty 100

## 2023-06-06 MED ORDER — SODIUM CHLORIDE 0.9 % IV SOLN: INTRAVENOUS | Status: DC

## 2023-06-06 MED ORDER — PROPOFOL 10 MG/ML IV BOLUS
INTRAVENOUS | Status: DC | PRN
  Administered 2023-06-06: 20 mg via INTRAVENOUS
  Administered 2023-06-06: 50 mg via INTRAVENOUS

## 2023-06-06 NOTE — Anesthesia Preprocedure Evaluation (Addendum)
Anesthesia Evaluation  Patient identified by MRN, date of birth, ID band Patient awake    Reviewed: Allergy & Precautions, NPO status , Patient's Chart, lab work & pertinent test results  History of Anesthesia Complications Negative for: history of anesthetic complications  Airway Mallampati: III  TM Distance: >3 FB Neck ROM: full    Dental no notable dental hx.    Pulmonary COPD,  COPD inhaler, Current Smoker and Patient abstained from smoking.   Pulmonary exam normal        Cardiovascular hypertension, + CAD (CAD as noted by minimal coronary artery calcification on recent CT chest 04/2023)  + dysrhythmias Atrial Fibrillation    Echo 04/2023 with normal biventricular systolic function, LVEF greater than 55% with no significant valvular abnormalities. Normal left atrial size.   Neuro/Psych  Neuromuscular disease  negative psych ROS   GI/Hepatic negative GI ROS, Neg liver ROS,,,  Endo/Other  negative endocrine ROS    Renal/GU negative Renal ROS  negative genitourinary   Musculoskeletal   Abdominal   Peds  Hematology negative hematology ROS (+)   Anesthesia Other Findings Past Medical History: 2013: Arthritis     Comment:  rt hip No date: Dyspnea No date: HLD (hyperlipidemia) No date: Hypertension No date: Tobacco dependence No date: Trigeminal neuralgia  Past Surgical History: 1985: APPENDECTOMY No date: CATARACT EXTRACTION; Bilateral 09/15/2021: COLONOSCOPY WITH PROPOFOL; N/A     Comment:  Procedure: COLONOSCOPY WITH PROPOFOL;  Surgeon:               Regis Bill, MD;  Location: [facility] END[facility];                Service: Endoscopy;  Laterality: N/A; 11/2015: EYE SURGERY; Bilateral     Comment:  [place] 11/17/2021: TOTAL HIP ARTHROPLASTY; Left     Comment:  Procedure: TOTAL HIP ARTHROPLASTY ANTERIOR APPROACH;                Surgeon: Kennedy Bucker, MD;  Location: [facility] ORS;                Service:  Orthopedics;  Laterality: Left; 02/16/2022: TOTAL HIP ARTHROPLASTY; Right     Comment:  Procedure: TOTAL HIP ARTHROPLASTY ANTERIOR APPROACH;                Surgeon: Kennedy Bucker, MD;  Location: [facility] ORS;                Service: Orthopedics;  Laterality: Right;     Reproductive/Obstetrics negative OB ROS                              Anesthesia Physical Anesthesia Plan  ASA: 3  Anesthesia Plan: General   Post-op Pain Management: Minimal or no pain anticipated   Induction: Intravenous  PONV Risk Score and Plan: 2 and Propofol infusion and TIVA  Airway Management Planned: Natural Airway and Nasal Cannula  Additional Equipment:   Intra-op Plan:   Post-operative Plan:   Informed Consent:      Dental Advisory Given  Plan Discussed with: Anesthesiologist, CRNA and Surgeon  Anesthesia Plan Comments:         Anesthesia Quick Evaluation

## 2023-06-06 NOTE — Transfer of Care (Signed)
Immediate Anesthesia Transfer of Care Note  Patient: Sarah Cunningham  Procedure(s) Performed: TRANSESOPHAGEAL ECHOCARDIOGRAM (TEE) CARDIOVERSION  Patient Location: PACU and special procedures  Anesthesia Type:General  Level of Consciousness: awake, drowsy, and patient cooperative  Airway & Oxygen Therapy: Patient Spontanous Breathing and Patient connected to face mask oxygen  Post-op Assessment: Report given to RN and Post -op Vital signs reviewed and stable  Post vital signs: Reviewed and stable  Last Vitals:  Vitals Value Taken Time  BP 114/93 06/06/23 0810  Temp    Pulse 107 06/06/23 0812  Resp 18 06/06/23 0812  SpO2 98 % 06/06/23 0812    Last Pain: There were no vitals filed for this visit.       Complications: No notable events documented.

## 2023-06-06 NOTE — CV Procedure (Signed)
Electrical Cardioversion Procedure Note  Indication: Atrial fibrillation  Procedure Details: Consent: Indication, Risk/benefits of procedure as well as the alternatives explained to patient and informed consent obtained. Time out performed. Verified patient identification, verified procedure, verified correct patient position, special equipment/implants available, medications/allergies/relevent history reviewed, required imaging and test results reviewed.  Deep sedation was provided by anesthesia with propofol. Patient was delivered with 200 Joules of electricity X 2 with success to sinus rhythm only for few beats and then back to atrial fibrillation. Patient tolerated the procedure well. No immediate complication noted.   Unsuccessful cardioversion  Sarah Norfolk, MD [facility]

## 2023-06-06 NOTE — Anesthesia Procedure Notes (Signed)
Procedure Name: MAC Date/Time: 06/06/2023 8:00 AM  Performed by: Elmarie Mainland, CRNAPre-anesthesia Checklist: Patient identified, Emergency Drugs available, Suction available and Patient being monitored Patient Re-evaluated:Patient Re-evaluated prior to induction Oxygen Delivery Method: Simple face mask

## 2023-06-06 NOTE — Progress Notes (Signed)
*  PRELIMINARY RESULTS* Echocardiogram Echocardiogram Transesophageal has been performed.  Sarah Cunningham 06/06/2023, 8:17 AM

## 2023-06-06 NOTE — Anesthesia Preprocedure Evaluation (Addendum)
Anesthesia Evaluation  Patient identified by MRN, date of birth, ID band Patient awake    Reviewed: Allergy & Precautions, NPO status , Patient's Chart, lab work & pertinent test results  History of Anesthesia Complications Negative for: history of anesthetic complications  Airway Mallampati: III  TM Distance: >3 FB Neck ROM: full    Dental no notable dental hx.    Pulmonary COPD,  COPD inhaler, Current SmokerPatient did not abstain from smoking.   Pulmonary exam normal        Cardiovascular hypertension, + CAD (CAD as noted by minimal coronary artery calcification on recent CT chest 04/2023)  + dysrhythmias Atrial Fibrillation  Rhythm:Irregular Rate:Tachycardia   Echo 04/2023 with normal biventricular systolic function, LVEF greater than 55% with no significant valvular abnormalities. Normal left atrial size.   Neuro/Psych  Neuromuscular disease  negative psych ROS   GI/Hepatic negative GI ROS, Neg liver ROS,,,  Endo/Other  negative endocrine ROS    Renal/GU negative Renal ROS  negative genitourinary   Musculoskeletal   Abdominal Normal abdominal exam  (+)   Peds  Hematology negative hematology ROS (+)   Anesthesia Other Findings Past Medical History: 2013: Arthritis     Comment:  rt hip No date: Dyspnea No date: HLD (hyperlipidemia) No date: Hypertension No date: Tobacco dependence No date: Trigeminal neuralgia  Past Surgical History: 1985: APPENDECTOMY No date: CATARACT EXTRACTION; Bilateral 09/15/2021: COLONOSCOPY WITH PROPOFOL; N/A     Comment:  Procedure: COLONOSCOPY WITH PROPOFOL;  Surgeon:               Regis Bill, MD;  Location: [facility];                Service: Endoscopy;  Laterality: N/A; 11/2015: EYE SURGERY; Bilateral     Comment:  [place] 11/17/2021: TOTAL HIP ARTHROPLASTY; Left     Comment:  Procedure: TOTAL HIP ARTHROPLASTY ANTERIOR APPROACH;                Surgeon: Kennedy Bucker, MD;  Location: [facility] ORS;                Service: Orthopedics;  Laterality: Left; 02/16/2022: TOTAL HIP ARTHROPLASTY; Right     Comment:  Procedure: TOTAL HIP ARTHROPLASTY ANTERIOR APPROACH;                Surgeon: Kennedy Bucker, MD;  Location: [facility] ORS;                Service: Orthopedics;  Laterality: Right;     Reproductive/Obstetrics negative OB ROS                             Anesthesia Physical Anesthesia Plan  ASA: 3  Anesthesia Plan: General   Post-op Pain Management: Minimal or no pain anticipated   Induction: Intravenous  PONV Risk Score and Plan: 2 and Propofol infusion and TIVA  Airway Management Planned: Natural Airway and Nasal Cannula  Additional Equipment:   Intra-op Plan:   Post-operative Plan:   Informed Consent: I have reviewed the patients History and Physical, chart, labs and discussed the procedure including the risks, benefits and alternatives for the proposed anesthesia with the patient or authorized representative who has indicated his/her understanding and acceptance.     Dental Advisory Given  Plan Discussed with: Anesthesiologist, CRNA and Surgeon  Anesthesia Plan Comments:        Anesthesia Quick Evaluation

## 2023-06-07 ENCOUNTER — Encounter: Payer: Self-pay | Admitting: Cardiology

## 2023-06-07 NOTE — Anesthesia Postprocedure Evaluation (Signed)
Anesthesia Post Note  Patient: Sarah Cunningham  Procedure(s) Performed: TRANSESOPHAGEAL ECHOCARDIOGRAM (TEE) CARDIOVERSION  Patient location during evaluation: PACU Anesthesia Type: General Level of consciousness: awake and alert Pain management: pain level controlled Vital Signs Assessment: post-procedure vital signs reviewed and stable Respiratory status: spontaneous breathing, nonlabored ventilation and respiratory function stable Cardiovascular status: blood pressure returned to baseline and stable Postop Assessment: no apparent nausea or vomiting Anesthetic complications: no   No notable events documented.   Last Vitals:  Vitals:   06/06/23 0811 06/06/23 0812  BP:    Pulse: (!) 101 (!) 107  Resp: 18 18  SpO2: 97% 98%    Last Pain: There were no vitals filed for this visit.               [name]

## 2023-06-08 ENCOUNTER — Encounter: Payer: Self-pay | Admitting: Cardiology

## 2023-06-08 DIAGNOSIS — I480 Paroxysmal atrial fibrillation: Secondary | ICD-10-CM | POA: Diagnosis not present

## 2023-06-08 LAB — ECHO TEE

## 2023-06-15 ENCOUNTER — Other Ambulatory Visit
Admission: RE | Admit: 2023-06-15 | Discharge: 2023-06-15 | Disposition: A | Discharge status: Home / Self Care | Payer: PPO | Source: Ambulatory Visit | Attending: Cardiology | Admitting: Cardiology

## 2023-06-15 DIAGNOSIS — I2510 Atherosclerotic heart disease of native coronary artery without angina pectoris: Secondary | ICD-10-CM | POA: Diagnosis not present

## 2023-06-15 DIAGNOSIS — E7800 Pure hypercholesterolemia, unspecified: Secondary | ICD-10-CM | POA: Diagnosis not present

## 2023-06-15 DIAGNOSIS — I5031 Acute diastolic (congestive) heart failure: Secondary | ICD-10-CM | POA: Insufficient documentation

## 2023-06-15 DIAGNOSIS — I2584 Coronary atherosclerosis due to calcified coronary lesion: Secondary | ICD-10-CM | POA: Diagnosis not present

## 2023-06-15 DIAGNOSIS — I4819 Other persistent atrial fibrillation: Secondary | ICD-10-CM | POA: Diagnosis not present

## 2023-06-15 DIAGNOSIS — I10 Essential (primary) hypertension: Secondary | ICD-10-CM | POA: Diagnosis not present

## 2023-06-15 LAB — BRAIN NATRIURETIC PEPTIDE: B Natriuretic Peptide: 1313.9 pg/mL — ABNORMAL HIGH (ref 0.0–100.0)

## 2023-06-22 DIAGNOSIS — I10 Essential (primary) hypertension: Secondary | ICD-10-CM | POA: Diagnosis not present

## 2023-06-22 DIAGNOSIS — I2510 Atherosclerotic heart disease of native coronary artery without angina pectoris: Secondary | ICD-10-CM | POA: Diagnosis not present

## 2023-06-22 DIAGNOSIS — I2584 Coronary atherosclerosis due to calcified coronary lesion: Secondary | ICD-10-CM | POA: Diagnosis not present

## 2023-06-22 DIAGNOSIS — I5031 Acute diastolic (congestive) heart failure: Secondary | ICD-10-CM | POA: Diagnosis not present

## 2023-06-22 DIAGNOSIS — I4819 Other persistent atrial fibrillation: Secondary | ICD-10-CM | POA: Diagnosis not present

## 2023-06-22 DIAGNOSIS — E7800 Pure hypercholesterolemia, unspecified: Secondary | ICD-10-CM | POA: Diagnosis not present

## 2023-06-23 DIAGNOSIS — J432 Centrilobular emphysema: Secondary | ICD-10-CM | POA: Diagnosis not present

## 2023-06-23 DIAGNOSIS — I4819 Other persistent atrial fibrillation: Secondary | ICD-10-CM | POA: Diagnosis not present

## 2023-06-23 DIAGNOSIS — J9809 Other diseases of bronchus, not elsewhere classified: Secondary | ICD-10-CM | POA: Diagnosis not present

## 2023-06-27 DIAGNOSIS — I5030 Unspecified diastolic (congestive) heart failure: Secondary | ICD-10-CM | POA: Diagnosis not present

## 2023-06-27 DIAGNOSIS — I10 Essential (primary) hypertension: Secondary | ICD-10-CM | POA: Diagnosis not present

## 2023-06-27 DIAGNOSIS — I4819 Other persistent atrial fibrillation: Secondary | ICD-10-CM | POA: Diagnosis not present

## 2023-06-27 DIAGNOSIS — I2584 Coronary atherosclerosis due to calcified coronary lesion: Secondary | ICD-10-CM | POA: Diagnosis not present

## 2023-06-27 DIAGNOSIS — I2510 Atherosclerotic heart disease of native coronary artery without angina pectoris: Secondary | ICD-10-CM | POA: Diagnosis not present

## 2023-07-04 DIAGNOSIS — G500 Trigeminal neuralgia: Secondary | ICD-10-CM | POA: Diagnosis not present

## 2023-07-04 DIAGNOSIS — I10 Essential (primary) hypertension: Secondary | ICD-10-CM | POA: Diagnosis not present

## 2023-07-04 DIAGNOSIS — I4819 Other persistent atrial fibrillation: Secondary | ICD-10-CM | POA: Diagnosis not present

## 2023-07-04 DIAGNOSIS — F17210 Nicotine dependence, cigarettes, uncomplicated: Secondary | ICD-10-CM | POA: Diagnosis not present

## 2023-07-04 DIAGNOSIS — Z01810 Encounter for preprocedural cardiovascular examination: Secondary | ICD-10-CM | POA: Diagnosis not present

## 2023-07-05 ENCOUNTER — Emergency Department
Admission: EM | Admit: 2023-07-05 | Discharge: 2023-07-05 | Discharge status: Against Medical Advice | Payer: PPO | Attending: Emergency Medicine | Admitting: Emergency Medicine

## 2023-07-05 ENCOUNTER — Emergency Department: Payer: PPO

## 2023-07-05 ENCOUNTER — Other Ambulatory Visit: Payer: Self-pay

## 2023-07-05 ENCOUNTER — Encounter: Payer: Self-pay | Admitting: Emergency Medicine

## 2023-07-05 ENCOUNTER — Other Ambulatory Visit: Payer: Self-pay | Admitting: Internal Medicine

## 2023-07-05 DIAGNOSIS — Z5321 Procedure and treatment not carried out due to patient leaving prior to being seen by health care provider: Secondary | ICD-10-CM | POA: Diagnosis not present

## 2023-07-05 DIAGNOSIS — I4819 Other persistent atrial fibrillation: Secondary | ICD-10-CM

## 2023-07-05 DIAGNOSIS — R55 Syncope and collapse: Secondary | ICD-10-CM | POA: Insufficient documentation

## 2023-07-05 DIAGNOSIS — R0989 Other specified symptoms and signs involving the circulatory and respiratory systems: Secondary | ICD-10-CM | POA: Diagnosis not present

## 2023-07-05 DIAGNOSIS — I495 Sick sinus syndrome: Secondary | ICD-10-CM

## 2023-07-05 DIAGNOSIS — R001 Bradycardia, unspecified: Secondary | ICD-10-CM

## 2023-07-05 DIAGNOSIS — R079 Chest pain, unspecified: Secondary | ICD-10-CM | POA: Diagnosis not present

## 2023-07-05 DIAGNOSIS — J90 Pleural effusion, not elsewhere classified: Secondary | ICD-10-CM | POA: Diagnosis not present

## 2023-07-05 DIAGNOSIS — R918 Other nonspecific abnormal finding of lung field: Secondary | ICD-10-CM | POA: Diagnosis not present

## 2023-07-05 DIAGNOSIS — R0789 Other chest pain: Secondary | ICD-10-CM | POA: Diagnosis not present

## 2023-07-05 LAB — BASIC METABOLIC PANEL
Anion gap: 13 (ref 5–15)
BUN: 34 mg/dL — ABNORMAL HIGH (ref 8–23)
CO2: 26 mmol/L (ref 22–32)
Calcium: 9.7 mg/dL (ref 8.9–10.3)
Chloride: 100 mmol/L (ref 98–111)
Creatinine, Ser: 1.15 mg/dL — ABNORMAL HIGH (ref 0.44–1.00)
GFR, Estimated: 48 mL/min — ABNORMAL LOW (ref >60)
Glucose, Bld: 103 mg/dL — ABNORMAL HIGH (ref 70–99)
Potassium: 4.2 mmol/L (ref 3.5–5.1)
Sodium: 139 mmol/L (ref 135–145)

## 2023-07-05 LAB — CBC
HCT: 44.2 % (ref 36.0–46.0)
Hemoglobin: 14.8 g/dL (ref 12.0–15.0)
MCH: 33 pg (ref 26.0–34.0)
MCHC: 33.5 g/dL (ref 30.0–36.0)
MCV: 98.7 fL (ref 80.0–100.0)
Platelets: 244 10*3/uL (ref 150–400)
RBC: 4.48 MIL/uL (ref 3.87–5.11)
RDW: 15.3 % (ref 11.5–15.5)
WBC: 10.3 10*3/uL (ref 4.0–10.5)
nRBC: 0.2 % (ref 0.0–0.2)

## 2023-07-05 LAB — PROTIME-INR
INR: 1.5 — ABNORMAL HIGH (ref 0.8–1.2)
Prothrombin Time: 18.4 s — ABNORMAL HIGH (ref 11.4–15.2)

## 2023-07-05 LAB — TROPONIN I (HIGH SENSITIVITY): Troponin I (High Sensitivity): 8 ng/L (ref <18)

## 2023-07-05 NOTE — ED Triage Notes (Signed)
 Patient to ED via POV for near syncope. States this occurred approx 1 hr while at the drug store and was then called by her doctor to come to ED due to significant pauses on her cardiac monitor. States she is having some chest pressure but that is normal for her. Has echo scheduled for thursday and cath on Friday.

## 2023-07-05 NOTE — ED Provider Triage Note (Signed)
 Emergency Medicine Provider Triage Evaluation Note  Sarah Cunningham , a 77 y.o. female  was evaluated in triage.  Pt complains of chest discomfort, near syncope, her physician called her to let her know she had significant pauses on her heart monitor.  Was told to come the ED immediately..  Review of Systems  Positive:  Negative:   Physical Exam  There were no vitals taken for this visit. Gen:   Awake, no distress   Resp:  Normal effort  MSK:   Moves extremities without difficulty  Other:    Medical Decision Making  Medically screening exam initiated at 3:29 PM.  Appropriate orders placed.  Sarah Cunningham was informed that the remainder of the evaluation will be completed by another provider, this initial triage assessment does not replace that evaluation, and the importance of remaining in the ED until their evaluation is complete.     Gasper Devere ORN, PA-C 07/05/23 1530

## 2023-07-07 ENCOUNTER — Telehealth: Payer: Self-pay | Admitting: Emergency Medicine

## 2023-07-07 ENCOUNTER — Encounter: Admission: RE | Payer: Self-pay | Source: Home / Self Care

## 2023-07-07 ENCOUNTER — Ambulatory Visit: Admission: RE | Admit: 2023-07-07 | Payer: PPO | Source: Home / Self Care | Admitting: Cardiology

## 2023-07-07 SURGERY — PACEMAKER IMPLANT
Anesthesia: Moderate Sedation

## 2023-07-07 NOTE — Telephone Encounter (Signed)
 Called patient due to left emergency department before provider exam to inquire about condition and follow up plans. Says she just had echo done and is scheduled at [facility] for ablation.  I told her to have her doctor review her cxr--she does not feel sick/fever or anything like that.  She has appt with pcp scheduled.

## 2023-07-08 DIAGNOSIS — J449 Chronic obstructive pulmonary disease, unspecified: Secondary | ICD-10-CM | POA: Diagnosis not present

## 2023-07-08 DIAGNOSIS — I2584 Coronary atherosclerosis due to calcified coronary lesion: Secondary | ICD-10-CM | POA: Diagnosis not present

## 2023-07-08 DIAGNOSIS — I4819 Other persistent atrial fibrillation: Secondary | ICD-10-CM | POA: Diagnosis not present

## 2023-07-08 DIAGNOSIS — Z7901 Long term (current) use of anticoagulants: Secondary | ICD-10-CM | POA: Diagnosis not present

## 2023-07-08 DIAGNOSIS — I509 Heart failure, unspecified: Secondary | ICD-10-CM | POA: Diagnosis not present

## 2023-07-08 DIAGNOSIS — I110 Hypertensive heart disease with heart failure: Secondary | ICD-10-CM | POA: Diagnosis not present

## 2023-07-08 DIAGNOSIS — I4719 Other supraventricular tachycardia: Secondary | ICD-10-CM | POA: Diagnosis not present

## 2023-07-08 DIAGNOSIS — G500 Trigeminal neuralgia: Secondary | ICD-10-CM | POA: Diagnosis not present

## 2023-07-08 DIAGNOSIS — I10 Essential (primary) hypertension: Secondary | ICD-10-CM | POA: Diagnosis not present

## 2023-07-08 DIAGNOSIS — I495 Sick sinus syndrome: Secondary | ICD-10-CM | POA: Diagnosis not present

## 2023-07-08 DIAGNOSIS — I252 Old myocardial infarction: Secondary | ICD-10-CM | POA: Diagnosis not present

## 2023-07-08 DIAGNOSIS — F17200 Nicotine dependence, unspecified, uncomplicated: Secondary | ICD-10-CM | POA: Diagnosis not present

## 2023-07-08 DIAGNOSIS — F17210 Nicotine dependence, cigarettes, uncomplicated: Secondary | ICD-10-CM | POA: Diagnosis not present

## 2023-07-08 DIAGNOSIS — I2510 Atherosclerotic heart disease of native coronary artery without angina pectoris: Secondary | ICD-10-CM | POA: Diagnosis not present

## 2023-07-08 DIAGNOSIS — Z79899 Other long term (current) drug therapy: Secondary | ICD-10-CM | POA: Diagnosis not present

## 2023-07-20 DIAGNOSIS — H3530 Unspecified macular degeneration: Secondary | ICD-10-CM | POA: Diagnosis not present

## 2023-08-17 DIAGNOSIS — I4819 Other persistent atrial fibrillation: Secondary | ICD-10-CM | POA: Diagnosis not present

## 2023-08-17 DIAGNOSIS — Z5181 Encounter for therapeutic drug level monitoring: Secondary | ICD-10-CM | POA: Diagnosis not present

## 2023-08-17 DIAGNOSIS — Z79899 Other long term (current) drug therapy: Secondary | ICD-10-CM | POA: Diagnosis not present

## 2023-08-30 DIAGNOSIS — Z8679 Personal history of other diseases of the circulatory system: Secondary | ICD-10-CM | POA: Diagnosis not present

## 2023-08-30 DIAGNOSIS — I480 Paroxysmal atrial fibrillation: Secondary | ICD-10-CM | POA: Diagnosis not present

## 2023-08-30 DIAGNOSIS — R531 Weakness: Secondary | ICD-10-CM | POA: Diagnosis not present

## 2023-08-30 DIAGNOSIS — E7800 Pure hypercholesterolemia, unspecified: Secondary | ICD-10-CM | POA: Diagnosis not present

## 2023-08-30 DIAGNOSIS — I2584 Coronary atherosclerosis due to calcified coronary lesion: Secondary | ICD-10-CM | POA: Diagnosis not present

## 2023-08-30 DIAGNOSIS — M8700 Idiopathic aseptic necrosis of unspecified bone: Secondary | ICD-10-CM | POA: Diagnosis not present

## 2023-08-30 DIAGNOSIS — J432 Centrilobular emphysema: Secondary | ICD-10-CM | POA: Diagnosis not present

## 2023-08-30 DIAGNOSIS — I2510 Atherosclerotic heart disease of native coronary artery without angina pectoris: Secondary | ICD-10-CM | POA: Diagnosis not present

## 2023-08-30 DIAGNOSIS — I4819 Other persistent atrial fibrillation: Secondary | ICD-10-CM | POA: Diagnosis not present

## 2023-08-30 DIAGNOSIS — I10 Essential (primary) hypertension: Secondary | ICD-10-CM | POA: Diagnosis not present

## 2023-08-30 DIAGNOSIS — I4891 Unspecified atrial fibrillation: Secondary | ICD-10-CM | POA: Diagnosis not present

## 2023-08-30 DIAGNOSIS — Z9889 Other specified postprocedural states: Secondary | ICD-10-CM | POA: Diagnosis not present

## 2023-08-30 DIAGNOSIS — I5032 Chronic diastolic (congestive) heart failure: Secondary | ICD-10-CM | POA: Diagnosis not present

## 2023-09-05 DIAGNOSIS — I4819 Other persistent atrial fibrillation: Secondary | ICD-10-CM | POA: Diagnosis not present

## 2023-09-06 DIAGNOSIS — M25611 Stiffness of right shoulder, not elsewhere classified: Secondary | ICD-10-CM | POA: Diagnosis not present

## 2023-09-06 DIAGNOSIS — M25511 Pain in right shoulder: Secondary | ICD-10-CM | POA: Diagnosis not present

## 2023-09-06 DIAGNOSIS — M549 Dorsalgia, unspecified: Secondary | ICD-10-CM | POA: Diagnosis not present

## 2023-09-06 DIAGNOSIS — M6281 Muscle weakness (generalized): Secondary | ICD-10-CM | POA: Diagnosis not present

## 2023-09-09 DIAGNOSIS — M549 Dorsalgia, unspecified: Secondary | ICD-10-CM | POA: Diagnosis not present

## 2023-09-09 DIAGNOSIS — M25611 Stiffness of right shoulder, not elsewhere classified: Secondary | ICD-10-CM | POA: Diagnosis not present

## 2023-09-09 DIAGNOSIS — M6281 Muscle weakness (generalized): Secondary | ICD-10-CM | POA: Diagnosis not present

## 2023-09-09 DIAGNOSIS — M25511 Pain in right shoulder: Secondary | ICD-10-CM | POA: Diagnosis not present

## 2023-09-14 DIAGNOSIS — M25611 Stiffness of right shoulder, not elsewhere classified: Secondary | ICD-10-CM | POA: Diagnosis not present

## 2023-09-14 DIAGNOSIS — M549 Dorsalgia, unspecified: Secondary | ICD-10-CM | POA: Diagnosis not present

## 2023-09-14 DIAGNOSIS — M25511 Pain in right shoulder: Secondary | ICD-10-CM | POA: Diagnosis not present

## 2023-09-14 DIAGNOSIS — M6281 Muscle weakness (generalized): Secondary | ICD-10-CM | POA: Diagnosis not present

## 2023-09-16 DIAGNOSIS — M25611 Stiffness of right shoulder, not elsewhere classified: Secondary | ICD-10-CM | POA: Diagnosis not present

## 2023-09-16 DIAGNOSIS — M25511 Pain in right shoulder: Secondary | ICD-10-CM | POA: Diagnosis not present

## 2023-09-16 DIAGNOSIS — M6281 Muscle weakness (generalized): Secondary | ICD-10-CM | POA: Diagnosis not present

## 2023-09-16 DIAGNOSIS — M549 Dorsalgia, unspecified: Secondary | ICD-10-CM | POA: Diagnosis not present

## 2023-09-20 DIAGNOSIS — M25611 Stiffness of right shoulder, not elsewhere classified: Secondary | ICD-10-CM | POA: Diagnosis not present

## 2023-09-20 DIAGNOSIS — M549 Dorsalgia, unspecified: Secondary | ICD-10-CM | POA: Diagnosis not present

## 2023-09-20 DIAGNOSIS — M25511 Pain in right shoulder: Secondary | ICD-10-CM | POA: Diagnosis not present

## 2023-09-20 DIAGNOSIS — M6281 Muscle weakness (generalized): Secondary | ICD-10-CM | POA: Diagnosis not present

## 2023-09-27 DIAGNOSIS — M549 Dorsalgia, unspecified: Secondary | ICD-10-CM | POA: Diagnosis not present

## 2023-09-27 DIAGNOSIS — M6281 Muscle weakness (generalized): Secondary | ICD-10-CM | POA: Diagnosis not present

## 2023-09-27 DIAGNOSIS — M25511 Pain in right shoulder: Secondary | ICD-10-CM | POA: Diagnosis not present

## 2023-09-27 DIAGNOSIS — M25611 Stiffness of right shoulder, not elsewhere classified: Secondary | ICD-10-CM | POA: Diagnosis not present

## 2023-09-29 DIAGNOSIS — M25611 Stiffness of right shoulder, not elsewhere classified: Secondary | ICD-10-CM | POA: Diagnosis not present

## 2023-09-29 DIAGNOSIS — M6281 Muscle weakness (generalized): Secondary | ICD-10-CM | POA: Diagnosis not present

## 2023-09-29 DIAGNOSIS — M25511 Pain in right shoulder: Secondary | ICD-10-CM | POA: Diagnosis not present

## 2023-09-29 DIAGNOSIS — M549 Dorsalgia, unspecified: Secondary | ICD-10-CM | POA: Diagnosis not present

## 2023-10-04 DIAGNOSIS — M6281 Muscle weakness (generalized): Secondary | ICD-10-CM | POA: Diagnosis not present

## 2023-10-04 DIAGNOSIS — M549 Dorsalgia, unspecified: Secondary | ICD-10-CM | POA: Diagnosis not present

## 2023-10-04 DIAGNOSIS — M25611 Stiffness of right shoulder, not elsewhere classified: Secondary | ICD-10-CM | POA: Diagnosis not present

## 2023-10-04 DIAGNOSIS — M25511 Pain in right shoulder: Secondary | ICD-10-CM | POA: Diagnosis not present

## 2023-10-06 DIAGNOSIS — M25611 Stiffness of right shoulder, not elsewhere classified: Secondary | ICD-10-CM | POA: Diagnosis not present

## 2023-10-06 DIAGNOSIS — M6281 Muscle weakness (generalized): Secondary | ICD-10-CM | POA: Diagnosis not present

## 2023-10-06 DIAGNOSIS — M25511 Pain in right shoulder: Secondary | ICD-10-CM | POA: Diagnosis not present

## 2023-10-06 DIAGNOSIS — M549 Dorsalgia, unspecified: Secondary | ICD-10-CM | POA: Diagnosis not present

## 2023-10-11 DIAGNOSIS — M549 Dorsalgia, unspecified: Secondary | ICD-10-CM | POA: Diagnosis not present

## 2023-10-11 DIAGNOSIS — M25511 Pain in right shoulder: Secondary | ICD-10-CM | POA: Diagnosis not present

## 2023-10-11 DIAGNOSIS — M6281 Muscle weakness (generalized): Secondary | ICD-10-CM | POA: Diagnosis not present

## 2023-10-11 DIAGNOSIS — M25611 Stiffness of right shoulder, not elsewhere classified: Secondary | ICD-10-CM | POA: Diagnosis not present

## 2023-10-25 DIAGNOSIS — M549 Dorsalgia, unspecified: Secondary | ICD-10-CM | POA: Diagnosis not present

## 2023-10-25 DIAGNOSIS — M25511 Pain in right shoulder: Secondary | ICD-10-CM | POA: Diagnosis not present

## 2023-10-25 DIAGNOSIS — M6281 Muscle weakness (generalized): Secondary | ICD-10-CM | POA: Diagnosis not present

## 2023-10-25 DIAGNOSIS — M25611 Stiffness of right shoulder, not elsewhere classified: Secondary | ICD-10-CM | POA: Diagnosis not present

## 2023-11-02 DIAGNOSIS — M6281 Muscle weakness (generalized): Secondary | ICD-10-CM | POA: Diagnosis not present

## 2023-11-02 DIAGNOSIS — M25611 Stiffness of right shoulder, not elsewhere classified: Secondary | ICD-10-CM | POA: Diagnosis not present

## 2023-11-02 DIAGNOSIS — M25511 Pain in right shoulder: Secondary | ICD-10-CM | POA: Diagnosis not present

## 2023-11-02 DIAGNOSIS — M549 Dorsalgia, unspecified: Secondary | ICD-10-CM | POA: Diagnosis not present

## 2023-11-10 DIAGNOSIS — I4819 Other persistent atrial fibrillation: Secondary | ICD-10-CM | POA: Diagnosis not present

## 2023-11-10 DIAGNOSIS — Z79899 Other long term (current) drug therapy: Secondary | ICD-10-CM | POA: Diagnosis not present

## 2023-11-10 DIAGNOSIS — Z5181 Encounter for therapeutic drug level monitoring: Secondary | ICD-10-CM | POA: Diagnosis not present

## 2023-11-17 DIAGNOSIS — M8700 Idiopathic aseptic necrosis of unspecified bone: Secondary | ICD-10-CM | POA: Diagnosis not present

## 2023-11-17 DIAGNOSIS — M810 Age-related osteoporosis without current pathological fracture: Secondary | ICD-10-CM | POA: Diagnosis not present

## 2023-11-17 DIAGNOSIS — E7800 Pure hypercholesterolemia, unspecified: Secondary | ICD-10-CM | POA: Diagnosis not present

## 2023-11-17 DIAGNOSIS — I480 Paroxysmal atrial fibrillation: Secondary | ICD-10-CM | POA: Diagnosis not present

## 2023-11-17 DIAGNOSIS — Z780 Asymptomatic menopausal state: Secondary | ICD-10-CM | POA: Diagnosis not present

## 2023-11-17 DIAGNOSIS — Z0000 Encounter for general adult medical examination without abnormal findings: Secondary | ICD-10-CM | POA: Diagnosis not present

## 2023-11-17 DIAGNOSIS — G500 Trigeminal neuralgia: Secondary | ICD-10-CM | POA: Diagnosis not present

## 2023-11-17 DIAGNOSIS — I10 Essential (primary) hypertension: Secondary | ICD-10-CM | POA: Diagnosis not present

## 2023-12-01 DIAGNOSIS — M810 Age-related osteoporosis without current pathological fracture: Secondary | ICD-10-CM | POA: Diagnosis not present

## 2023-12-12 DIAGNOSIS — I480 Paroxysmal atrial fibrillation: Secondary | ICD-10-CM | POA: Diagnosis not present

## 2023-12-12 DIAGNOSIS — M810 Age-related osteoporosis without current pathological fracture: Secondary | ICD-10-CM | POA: Diagnosis not present

## 2023-12-12 DIAGNOSIS — M25512 Pain in left shoulder: Secondary | ICD-10-CM | POA: Diagnosis not present

## 2023-12-12 DIAGNOSIS — E7800 Pure hypercholesterolemia, unspecified: Secondary | ICD-10-CM | POA: Diagnosis not present

## 2023-12-27 DIAGNOSIS — M7582 Other shoulder lesions, left shoulder: Secondary | ICD-10-CM | POA: Diagnosis not present

## 2023-12-27 DIAGNOSIS — M19012 Primary osteoarthritis, left shoulder: Secondary | ICD-10-CM | POA: Diagnosis not present

## 2024-01-26 DIAGNOSIS — Z961 Presence of intraocular lens: Secondary | ICD-10-CM | POA: Diagnosis not present

## 2024-01-26 DIAGNOSIS — H43813 Vitreous degeneration, bilateral: Secondary | ICD-10-CM | POA: Diagnosis not present

## 2024-01-26 DIAGNOSIS — H353132 Nonexudative age-related macular degeneration, bilateral, intermediate dry stage: Secondary | ICD-10-CM | POA: Diagnosis not present

## 2024-02-07 IMAGING — DX DG HIP (WITH OR WITHOUT PELVIS) 2-3V*L*
2 series · 2 of 2 positions shown · non-contrast
Comparison: None Available.

CLINICAL DATA: Status post total hip arthroplasty.

EXAM:
DG HIP (WITH OR WITHOUT PELVIS) 2-3V LEFT

[hip ap]
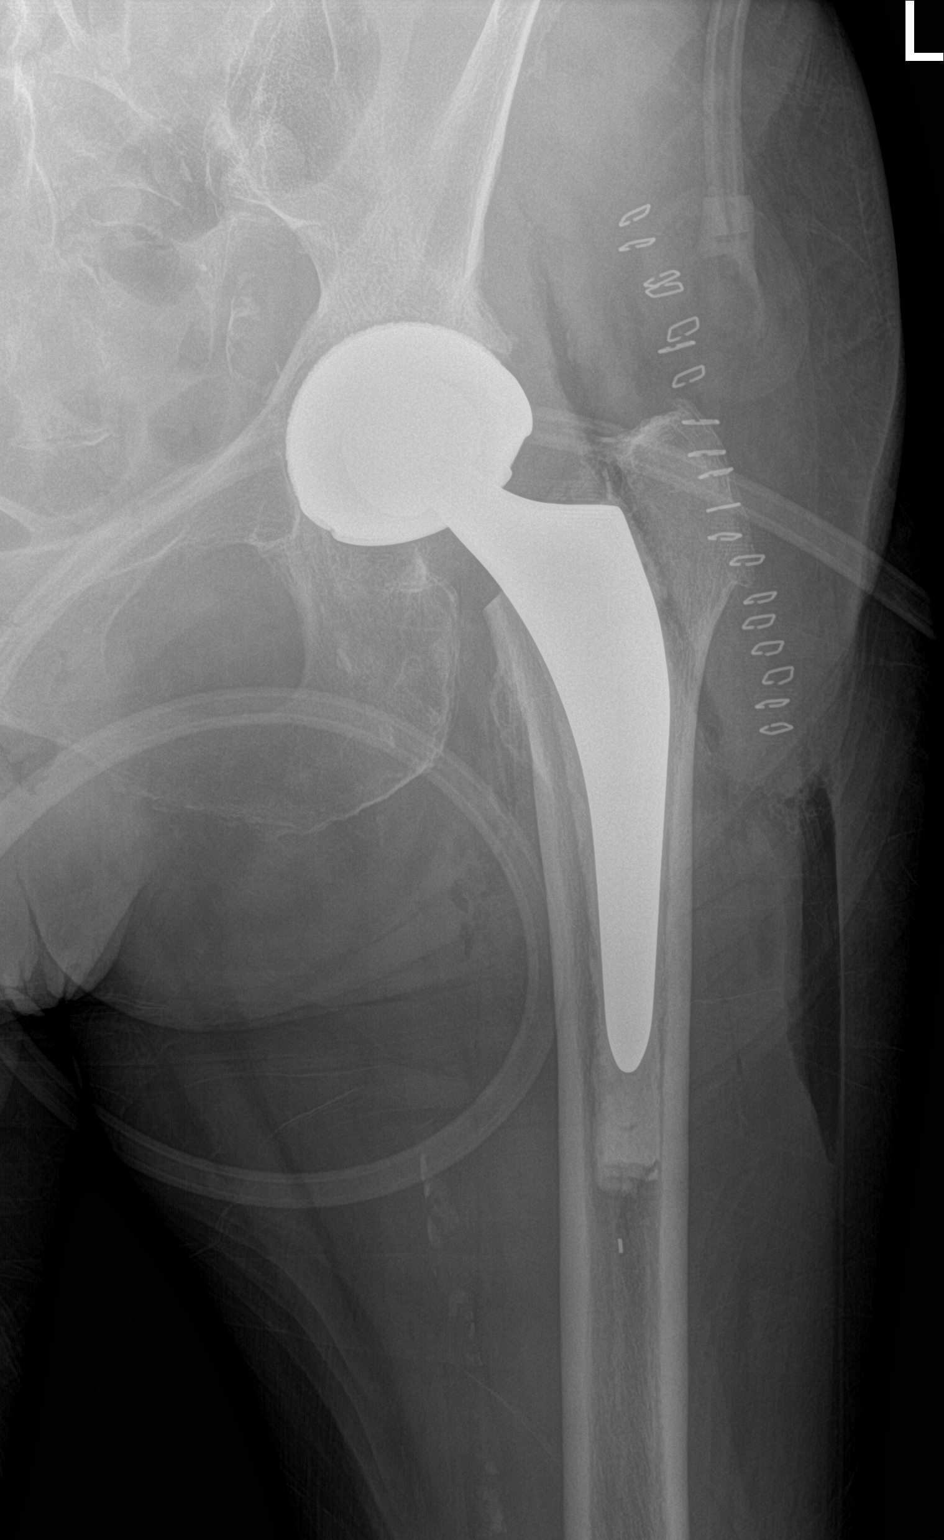

[hip lat]
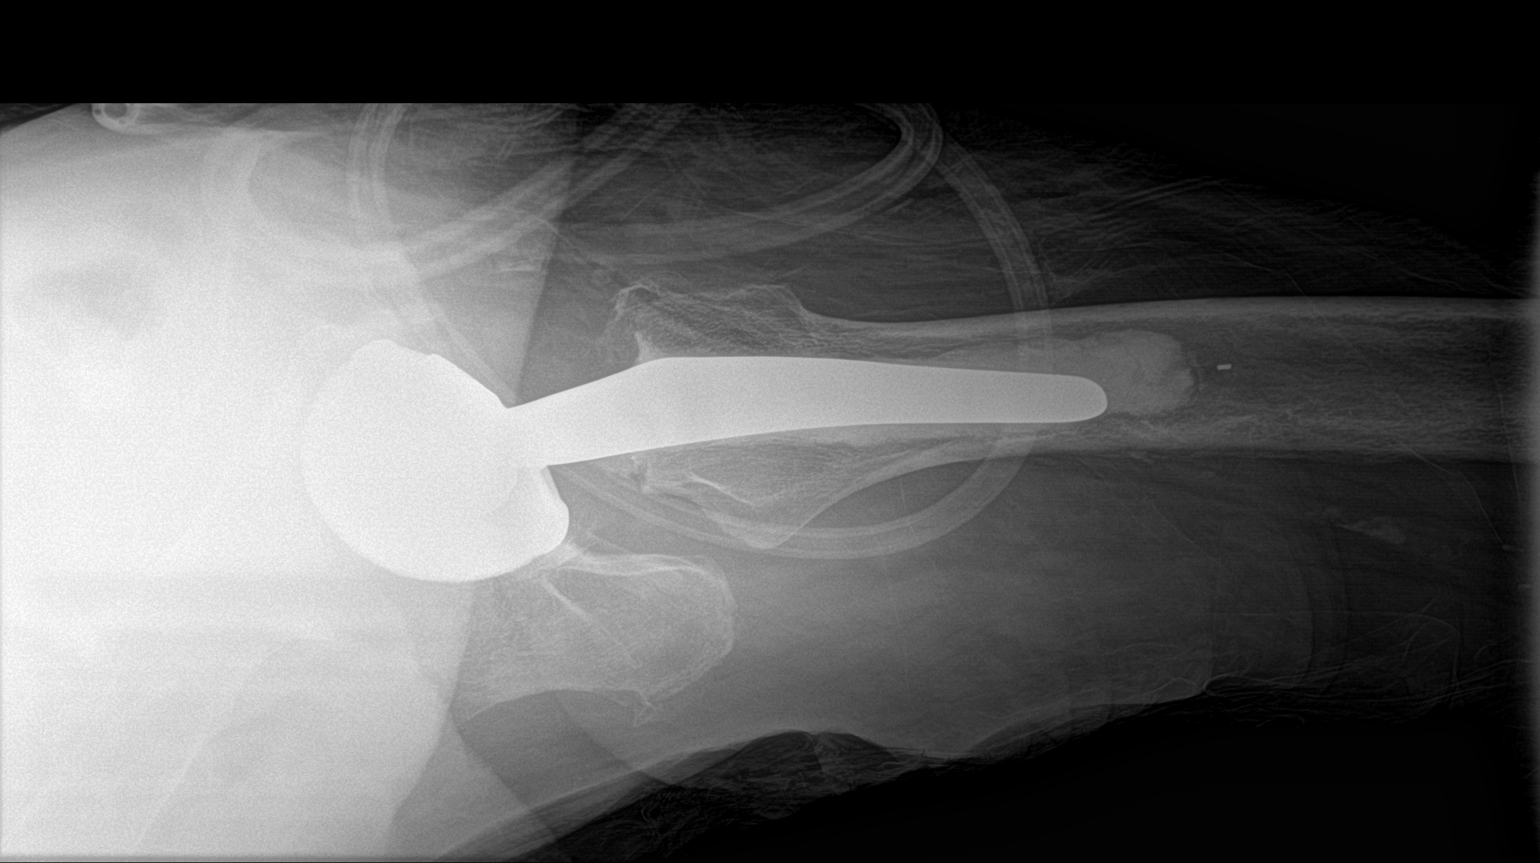

[2 of 2 positions shown; findings below may reference images not displayed]

FINDINGS: Status post left hip arthroplasty with postsurgical changes as
expected. The hardware is intact without evidence of acute
complications.
IMPRESSION: Status post left hip arthroplasty.

## 2024-02-15 DIAGNOSIS — I4819 Other persistent atrial fibrillation: Secondary | ICD-10-CM | POA: Diagnosis not present

## 2024-02-22 DIAGNOSIS — M7582 Other shoulder lesions, left shoulder: Secondary | ICD-10-CM | POA: Diagnosis not present

## 2024-02-22 DIAGNOSIS — I4819 Other persistent atrial fibrillation: Secondary | ICD-10-CM | POA: Diagnosis not present

## 2024-02-22 DIAGNOSIS — M19012 Primary osteoarthritis, left shoulder: Secondary | ICD-10-CM | POA: Diagnosis not present

## 2024-02-22 DIAGNOSIS — G500 Trigeminal neuralgia: Secondary | ICD-10-CM | POA: Diagnosis not present

## 2024-02-22 DIAGNOSIS — M25412 Effusion, left shoulder: Secondary | ICD-10-CM | POA: Diagnosis not present

## 2024-02-24 ENCOUNTER — Other Ambulatory Visit: Payer: Self-pay | Admitting: Orthopedic Surgery

## 2024-02-24 DIAGNOSIS — M25412 Effusion, left shoulder: Secondary | ICD-10-CM

## 2024-02-24 DIAGNOSIS — M19012 Primary osteoarthritis, left shoulder: Secondary | ICD-10-CM

## 2024-02-24 DIAGNOSIS — M7582 Other shoulder lesions, left shoulder: Secondary | ICD-10-CM

## 2024-02-27 ENCOUNTER — Ambulatory Visit
Admission: RE | Admit: 2024-02-27 | Discharge: 2024-02-27 | Disposition: A | Discharge status: Home / Self Care | Source: Ambulatory Visit | Attending: Orthopedic Surgery | Admitting: Orthopedic Surgery

## 2024-02-27 DIAGNOSIS — S43432A Superior glenoid labrum lesion of left shoulder, initial encounter: Secondary | ICD-10-CM | POA: Diagnosis not present

## 2024-02-27 DIAGNOSIS — S46812A Strain of other muscles, fascia and tendons at shoulder and upper arm level, left arm, initial encounter: Secondary | ICD-10-CM | POA: Diagnosis not present

## 2024-02-27 DIAGNOSIS — M19012 Primary osteoarthritis, left shoulder: Secondary | ICD-10-CM | POA: Diagnosis not present

## 2024-02-27 DIAGNOSIS — M25412 Effusion, left shoulder: Secondary | ICD-10-CM | POA: Insufficient documentation

## 2024-02-27 DIAGNOSIS — M7582 Other shoulder lesions, left shoulder: Secondary | ICD-10-CM | POA: Diagnosis not present

## 2024-02-27 DIAGNOSIS — S46212A Strain of muscle, fascia and tendon of other parts of biceps, left arm, initial encounter: Secondary | ICD-10-CM | POA: Diagnosis not present

## 2024-02-28 DIAGNOSIS — Z7901 Long term (current) use of anticoagulants: Secondary | ICD-10-CM | POA: Diagnosis not present

## 2024-02-28 DIAGNOSIS — E7800 Pure hypercholesterolemia, unspecified: Secondary | ICD-10-CM | POA: Diagnosis not present

## 2024-02-28 DIAGNOSIS — Z9889 Other specified postprocedural states: Secondary | ICD-10-CM | POA: Diagnosis not present

## 2024-02-28 DIAGNOSIS — I5032 Chronic diastolic (congestive) heart failure: Secondary | ICD-10-CM | POA: Diagnosis not present

## 2024-02-28 DIAGNOSIS — I10 Essential (primary) hypertension: Secondary | ICD-10-CM | POA: Diagnosis not present

## 2024-02-28 DIAGNOSIS — I4819 Other persistent atrial fibrillation: Secondary | ICD-10-CM | POA: Diagnosis not present

## 2024-02-28 DIAGNOSIS — Z8679 Personal history of other diseases of the circulatory system: Secondary | ICD-10-CM | POA: Diagnosis not present

## 2024-03-21 DIAGNOSIS — I480 Paroxysmal atrial fibrillation: Secondary | ICD-10-CM | POA: Diagnosis not present

## 2024-03-21 DIAGNOSIS — M810 Age-related osteoporosis without current pathological fracture: Secondary | ICD-10-CM | POA: Diagnosis not present

## 2024-03-21 DIAGNOSIS — G500 Trigeminal neuralgia: Secondary | ICD-10-CM | POA: Diagnosis not present

## 2024-03-21 DIAGNOSIS — I10 Essential (primary) hypertension: Secondary | ICD-10-CM | POA: Diagnosis not present

## 2024-03-21 DIAGNOSIS — M67912 Unspecified disorder of synovium and tendon, left shoulder: Secondary | ICD-10-CM | POA: Diagnosis not present

## 2024-06-13 DIAGNOSIS — G500 Trigeminal neuralgia: Secondary | ICD-10-CM | POA: Diagnosis not present

## 2024-06-13 DIAGNOSIS — I4819 Other persistent atrial fibrillation: Secondary | ICD-10-CM | POA: Diagnosis not present
